# Patient Record
Sex: Female | Born: 1940 | Race: Black or African American | Hispanic: No | Smoking: Never smoker
Health system: Southern US, Community
[De-identification: ages and names within clinical notes are randomized; demographics above are authoritative.]

## PROBLEM LIST (undated history)

## (undated) DIAGNOSIS — I10 Essential (primary) hypertension: Secondary | ICD-10-CM

## (undated) DIAGNOSIS — E785 Hyperlipidemia, unspecified: Secondary | ICD-10-CM

## (undated) DIAGNOSIS — E119 Type 2 diabetes mellitus without complications: Secondary | ICD-10-CM

## (undated) DIAGNOSIS — I509 Heart failure, unspecified: Secondary | ICD-10-CM

## (undated) HISTORY — PX: HEMORRHOID SURGERY: SHX153

## (undated) HISTORY — PX: ABDOMINAL HYSTERECTOMY: SHX81

## (undated) HISTORY — DX: Heart failure, unspecified: I50.9

## (undated) HISTORY — PX: OTHER SURGICAL HISTORY: SHX169

---

## 2007-12-03 ENCOUNTER — Ambulatory Visit: Payer: Self-pay | Admitting: Family Medicine

## 2010-04-27 ENCOUNTER — Ambulatory Visit: Payer: Self-pay | Admitting: Family Medicine

## 2010-09-19 ENCOUNTER — Ambulatory Visit: Payer: Self-pay | Admitting: Family Medicine

## 2011-10-16 ENCOUNTER — Ambulatory Visit: Payer: Self-pay | Admitting: Family Medicine

## 2012-08-26 ENCOUNTER — Ambulatory Visit: Payer: Self-pay | Admitting: Gastroenterology

## 2016-03-01 ENCOUNTER — Inpatient Hospital Stay
Admission: EM | Admit: 2016-03-01 | Discharge: 2016-03-05 | DRG: 441 | Disposition: A | Discharge status: Home / Self Care | Payer: Medicare Other | Attending: Internal Medicine | Admitting: Internal Medicine

## 2016-03-01 ENCOUNTER — Emergency Department: Payer: Medicare Other

## 2016-03-01 ENCOUNTER — Encounter: Payer: Self-pay | Admitting: Emergency Medicine

## 2016-03-01 DIAGNOSIS — Z8249 Family history of ischemic heart disease and other diseases of the circulatory system: Secondary | ICD-10-CM | POA: Diagnosis not present

## 2016-03-01 DIAGNOSIS — K5730 Diverticulosis of large intestine without perforation or abscess without bleeding: Secondary | ICD-10-CM | POA: Diagnosis present

## 2016-03-01 DIAGNOSIS — A4151 Sepsis due to Escherichia coli [E. coli]: Secondary | ICD-10-CM | POA: Diagnosis present

## 2016-03-01 DIAGNOSIS — Z7982 Long term (current) use of aspirin: Secondary | ICD-10-CM | POA: Diagnosis not present

## 2016-03-01 DIAGNOSIS — I81 Portal vein thrombosis: Secondary | ICD-10-CM | POA: Diagnosis present

## 2016-03-01 DIAGNOSIS — I10 Essential (primary) hypertension: Secondary | ICD-10-CM | POA: Diagnosis present

## 2016-03-01 DIAGNOSIS — Z794 Long term (current) use of insulin: Secondary | ICD-10-CM | POA: Diagnosis not present

## 2016-03-01 DIAGNOSIS — I999 Unspecified disorder of circulatory system: Secondary | ICD-10-CM | POA: Diagnosis present

## 2016-03-01 DIAGNOSIS — Z79899 Other long term (current) drug therapy: Secondary | ICD-10-CM | POA: Diagnosis not present

## 2016-03-01 DIAGNOSIS — I879 Disorder of vein, unspecified: Secondary | ICD-10-CM

## 2016-03-01 DIAGNOSIS — E785 Hyperlipidemia, unspecified: Secondary | ICD-10-CM | POA: Diagnosis present

## 2016-03-01 DIAGNOSIS — Z806 Family history of leukemia: Secondary | ICD-10-CM

## 2016-03-01 DIAGNOSIS — E119 Type 2 diabetes mellitus without complications: Secondary | ICD-10-CM | POA: Diagnosis present

## 2016-03-01 DIAGNOSIS — A419 Sepsis, unspecified organism: Secondary | ICD-10-CM | POA: Diagnosis present

## 2016-03-01 HISTORY — DX: Essential (primary) hypertension: I10

## 2016-03-01 HISTORY — DX: Hyperlipidemia, unspecified: E78.5

## 2016-03-01 HISTORY — DX: Type 2 diabetes mellitus without complications: E11.9

## 2016-03-01 LAB — COMPREHENSIVE METABOLIC PANEL
ALT: 50 U/L (ref 14–54)
AST: 48 U/L — ABNORMAL HIGH (ref 15–41)
Albumin: 3.5 g/dL (ref 3.5–5.0)
Alkaline Phosphatase: 108 U/L (ref 38–126)
Anion gap: 7 (ref 5–15)
BUN: 15 mg/dL (ref 6–20)
CALCIUM: 9.8 mg/dL (ref 8.9–10.3)
CHLORIDE: 102 mmol/L (ref 101–111)
CO2: 29 mmol/L (ref 22–32)
CREATININE: 1.21 mg/dL — AB (ref 0.44–1.00)
GFR calc Af Amer: 50 mL/min — ABNORMAL LOW (ref >60)
GFR calc non Af Amer: 43 mL/min — ABNORMAL LOW (ref >60)
Glucose, Bld: 115 mg/dL — ABNORMAL HIGH (ref 65–99)
Potassium: 3.6 mmol/L (ref 3.5–5.1)
SODIUM: 138 mmol/L (ref 135–145)
Total Bilirubin: 0.7 mg/dL (ref 0.3–1.2)
Total Protein: 7.7 g/dL (ref 6.5–8.1)

## 2016-03-01 LAB — URINALYSIS COMPLETE WITH MICROSCOPIC (ARMC ONLY)
BILIRUBIN URINE: NEGATIVE
Bacteria, UA: NONE SEEN
GLUCOSE, UA: NEGATIVE mg/dL
KETONES UR: NEGATIVE mg/dL
Leukocytes, UA: NEGATIVE
Nitrite: NEGATIVE
Protein, ur: NEGATIVE mg/dL
SPECIFIC GRAVITY, URINE: 1.043 — AB (ref 1.005–1.030)
pH: 5 (ref 5.0–8.0)

## 2016-03-01 LAB — LIPASE, BLOOD: Lipase: 16 U/L (ref 11–51)

## 2016-03-01 LAB — CBC WITH DIFFERENTIAL/PLATELET
Basophils Absolute: 0.1 10*3/uL (ref 0–0.1)
EOS ABS: 0.3 10*3/uL (ref 0–0.7)
HCT: 34.6 % — ABNORMAL LOW (ref 35.0–47.0)
Hemoglobin: 11.5 g/dL — ABNORMAL LOW (ref 12.0–16.0)
LYMPHS ABS: 1.6 10*3/uL (ref 1.0–3.6)
Lymphocytes Relative: 11 %
MCH: 28 pg (ref 26.0–34.0)
MCHC: 33.2 g/dL (ref 32.0–36.0)
MCV: 84.4 fL (ref 80.0–100.0)
Monocytes Absolute: 0.8 10*3/uL (ref 0.2–0.9)
Neutro Abs: 12 10*3/uL — ABNORMAL HIGH (ref 1.4–6.5)
Neutrophils Relative %: 81 %
PLATELETS: 315 10*3/uL (ref 150–440)
RBC: 4.1 MIL/uL (ref 3.80–5.20)
RDW: 13.6 % (ref 11.5–14.5)
WBC: 14.9 10*3/uL — AB (ref 3.6–11.0)

## 2016-03-01 LAB — PROTIME-INR
INR: 1.04
Prothrombin Time: 13.8 seconds (ref 11.4–15.0)

## 2016-03-01 LAB — BLOOD GAS, VENOUS
ACID-BASE EXCESS: 5.6 mmol/L — AB (ref 0.0–3.0)
Bicarbonate: 31.7 mEq/L — ABNORMAL HIGH (ref 21.0–28.0)
PATIENT TEMPERATURE: 37
pCO2, Ven: 50 mmHg (ref 44.0–60.0)
pH, Ven: 7.41 (ref 7.320–7.430)
pO2, Ven: <31 mmHg — ABNORMAL LOW (ref 31.0–45.0)

## 2016-03-01 LAB — GLUCOSE, CAPILLARY: GLUCOSE-CAPILLARY: 204 mg/dL — AB (ref 65–99)

## 2016-03-01 LAB — LACTIC ACID, PLASMA
LACTIC ACID, VENOUS: 0.9 mmol/L (ref 0.5–2.0)
Lactic Acid, Venous: 0.8 mmol/L (ref 0.5–2.0)

## 2016-03-01 LAB — APTT: aPTT: 28 seconds (ref 24–36)

## 2016-03-01 MED ORDER — ACETAMINOPHEN 325 MG PO TABS: 650.0000 mg | ORAL_TABLET | Freq: Four times a day (QID) | ORAL | Status: DC | PRN

## 2016-03-01 MED ORDER — IOPAMIDOL (ISOVUE-300) INJECTION 61%
100.0000 mL | Freq: Once | INTRAVENOUS | Status: AC | PRN
  Administered 2016-03-01: 100 mL via INTRAVENOUS

## 2016-03-01 MED ORDER — ONDANSETRON HCL 4 MG/2ML IJ SOLN: 4.0000 mg | Freq: Four times a day (QID) | INTRAMUSCULAR | Status: DC | PRN

## 2016-03-01 MED ORDER — DOCUSATE SODIUM 100 MG PO CAPS
100.0000 mg | ORAL_CAPSULE | Freq: Two times a day (BID) | ORAL | Status: DC
  Administered 2016-03-02: 100 mg via ORAL
  Filled 2016-03-01 (×3): qty 1

## 2016-03-01 MED ORDER — FENTANYL CITRATE (PF) 100 MCG/2ML IJ SOLN: 100.0000 ug | Freq: Once | INTRAMUSCULAR | Status: DC

## 2016-03-01 MED ORDER — LISINOPRIL 20 MG PO TABS
40.0000 mg | ORAL_TABLET | Freq: Every day | ORAL | Status: DC
  Administered 2016-03-02 – 2016-03-05 (×4): 40 mg via ORAL
  Filled 2016-03-01 (×4): qty 2

## 2016-03-01 MED ORDER — MORPHINE SULFATE (PF) 2 MG/ML IV SOLN: 2.0000 mg | INTRAVENOUS | Status: DC | PRN

## 2016-03-01 MED ORDER — INSULIN ASPART PROT & ASPART (70-30 MIX) 100 UNIT/ML ~~LOC~~ SUSP
20.0000 [IU] | Freq: Two times a day (BID) | SUBCUTANEOUS | Status: DC
  Administered 2016-03-02 – 2016-03-05 (×5): 20 [IU] via SUBCUTANEOUS
  Filled 2016-03-01 (×6): qty 20

## 2016-03-01 MED ORDER — SODIUM CHLORIDE 0.9 % IV BOLUS (SEPSIS)
1000.0000 mL | Freq: Once | INTRAVENOUS | Status: AC
  Administered 2016-03-01: 1000 mL via INTRAVENOUS

## 2016-03-01 MED ORDER — DEXTROSE 5 % IV SOLN
1.0000 g | INTRAVENOUS | Status: DC
  Filled 2016-03-01: qty 10

## 2016-03-01 MED ORDER — OXYCODONE HCL 5 MG PO TABS: 5.0000 mg | ORAL_TABLET | ORAL | Status: DC | PRN

## 2016-03-01 MED ORDER — DIATRIZOATE MEGLUMINE & SODIUM 66-10 % PO SOLN
15.0000 mL | Freq: Once | ORAL | Status: AC
Start: 2016-03-01 — End: 2016-03-01
  Administered 2016-03-01: 15 mL via ORAL

## 2016-03-01 MED ORDER — SODIUM CHLORIDE 0.9 % IV SOLN
INTRAVENOUS | Status: AC
  Administered 2016-03-01 – 2016-03-02 (×2): via INTRAVENOUS

## 2016-03-01 MED ORDER — SIMVASTATIN 20 MG PO TABS
20.0000 mg | ORAL_TABLET | Freq: Every day | ORAL | Status: DC
  Administered 2016-03-02 – 2016-03-05 (×4): 20 mg via ORAL
  Filled 2016-03-01 (×4): qty 1

## 2016-03-01 MED ORDER — INSULIN ASPART 100 UNIT/ML ~~LOC~~ SOLN
0.0000 [IU] | Freq: Three times a day (TID) | SUBCUTANEOUS | Status: DC
  Administered 2016-03-04 (×2): 1 [IU] via SUBCUTANEOUS
  Filled 2016-03-01 (×2): qty 1

## 2016-03-01 MED ORDER — GLIPIZIDE ER 5 MG PO TB24
10.0000 mg | ORAL_TABLET | Freq: Two times a day (BID) | ORAL | Status: DC
  Administered 2016-03-02: 09:00:00 10 mg via ORAL
  Filled 2016-03-01: qty 2

## 2016-03-01 MED ORDER — INSULIN ASPART 100 UNIT/ML ~~LOC~~ SOLN
0.0000 [IU] | Freq: Every day | SUBCUTANEOUS | Status: DC
  Administered 2016-03-01: 2 [IU] via SUBCUTANEOUS
  Filled 2016-03-01: qty 2

## 2016-03-01 MED ORDER — AMLODIPINE BESYLATE 10 MG PO TABS
10.0000 mg | ORAL_TABLET | Freq: Every day | ORAL | Status: DC
  Administered 2016-03-02 – 2016-03-05 (×4): 10 mg via ORAL
  Filled 2016-03-01 (×4): qty 1

## 2016-03-01 MED ORDER — FENTANYL CITRATE (PF) 100 MCG/2ML IJ SOLN
INTRAMUSCULAR | Status: AC
  Administered 2016-03-01: 100 ug via INTRAVENOUS
  Filled 2016-03-01: qty 2

## 2016-03-01 MED ORDER — HEPARIN BOLUS VIA INFUSION
4500.0000 [IU] | Freq: Once | INTRAVENOUS | Status: AC
  Administered 2016-03-01: 4500 [IU] via INTRAVENOUS
  Filled 2016-03-01: qty 4500

## 2016-03-01 MED ORDER — GLIPIZIDE ER 5 MG PO TB24: 10.0000 mg | ORAL_TABLET | Freq: Two times a day (BID) | ORAL | Status: DC

## 2016-03-01 MED ORDER — ONDANSETRON HCL 4 MG PO TABS: 4.0000 mg | ORAL_TABLET | Freq: Four times a day (QID) | ORAL | Status: DC | PRN

## 2016-03-01 MED ORDER — HEPARIN (PORCINE) IN NACL 100-0.45 UNIT/ML-% IJ SOLN
1800.0000 [IU]/h | INTRAMUSCULAR | Status: DC
  Administered 2016-03-01: 1400 [IU]/h via INTRAVENOUS
  Administered 2016-03-03 (×2): 1800 [IU]/h via INTRAVENOUS
  Filled 2016-03-01 (×10): qty 250

## 2016-03-01 MED ORDER — ACETAMINOPHEN 500 MG PO TABS
1000.0000 mg | ORAL_TABLET | Freq: Once | ORAL | Status: AC
  Administered 2016-03-01: 1000 mg via ORAL
  Filled 2016-03-01: qty 2

## 2016-03-01 MED ORDER — FENTANYL CITRATE (PF) 100 MCG/2ML IJ SOLN
100.0000 ug | Freq: Once | INTRAMUSCULAR | Status: AC
  Administered 2016-03-01: 100 ug via INTRAVENOUS

## 2016-03-01 MED ORDER — POLYETHYLENE GLYCOL 3350 17 G PO PACK: 17.0000 g | PACK | Freq: Every day | ORAL | Status: DC | PRN

## 2016-03-01 MED ORDER — DEXTROSE 5 % IV SOLN
1.0000 g | Freq: Once | INTRAVENOUS | Status: AC
  Administered 2016-03-02: 1 g via INTRAVENOUS
  Filled 2016-03-01: qty 10

## 2016-03-01 MED ORDER — PIPERACILLIN-TAZOBACTAM 3.375 G IVPB: 3.3750 g | Freq: Three times a day (TID) | INTRAVENOUS | Status: DC

## 2016-03-01 MED ORDER — ACETAMINOPHEN 650 MG RE SUPP
650.0000 mg | Freq: Four times a day (QID) | RECTAL | Status: DC | PRN
Start: 2016-03-01 — End: 2016-03-05

## 2016-03-01 MED ORDER — PIPERACILLIN-TAZOBACTAM 3.375 G IVPB 30 MIN
3.3750 g | Freq: Once | INTRAVENOUS | Status: AC
  Administered 2016-03-01: 3.375 g via INTRAVENOUS
  Filled 2016-03-01: qty 50

## 2016-03-01 NOTE — Progress Notes (Signed)
ANTICOAGULATION CONSULT NOTE - Initial Consult  Pharmacy Consult for Heparin Drip Indication: portal vein thrombosis  No Known Allergies  Patient Measurements: Weight: 203 lb 0.7 oz (92.1 kg) Heparin Dosing Weight: 83.5 kg Ht from Care Everywhere of 68 in Est CrCl~49 mL/min  Vital Signs: Temp: 99.2 F (37.3 C) (05/25 1937) Temp Source: Oral (05/25 1937) BP: 146/59 mmHg (05/25 1800) Pulse Rate: 96 (05/25 2000)  Labs:  Recent Labs  03/01/16 1630 03/01/16 1650  HGB 11.5*  --   HCT 34.6*  --   PLT 315  --   APTT  --  28  LABPROT  --  13.8  INR  --  1.04  CREATININE 1.21*  --     CrCl cannot be calculated (Unknown ideal weight.).   Medical History: Past Medical History  Diagnosis Date  . Diabetes mellitus without complication (HCC)     Medications:  Scheduled:   Infusions:  . heparin 1,400 Units/hr (03/01/16 2013)  . piperacillin-tazobactam (ZOSYN)  IV      Assessment: Pharmacy consulted to dose heparin drip in a 75 yo female with portal vein thrombosis.    INR and aPTT pending - no anticoagulants listed in H/P or med rec  Goal of Therapy:  Heparin level 0.3-0.7 units/ml Monitor platelets by anticoagulation protocol: Yes   Plan:  Will give bolus of 4500 units IV once and start drip at rate of 1400 units/hr. Will check HL in 8 hours based on age >70 years. CBC ordered in AM.   Pharmacy will continue to follow.  Shanay Woolman G 03/01/2016,8:25 PM

## 2016-03-01 NOTE — H&P (Signed)
[facility] [place] at [facility]   PATIENT NAME: Becky Sutton    MR#:  [id number]  DATE OF BIRTH:  [birth year]  DATE OF ADMISSION:  03/01/2016  PRIMARY CARE PHYSICIAN: No primary care provider on file.   REQUESTING/REFERRING PHYSICIAN: Dr. Glenetta Hew  CHIEF COMPLAINT:   Chief Complaint  Patient presents with  . Fever    HISTORY OF PRESENT ILLNESS:  Becky Sutton  is a 75 y.o. female with a known history of Hypertension, insulin-dependent diabetes mellitus, hyperlipidemia comes from home secondary to fevers and chills. Patient started feeling sick since yesterday. She had chills all day yesterday, very weak and in morning get out of her bed. Got better towards the end of the day. This morning she woke up she had chills again, temperature of 102.63F here in the emergency room. WBC is elevated. Denies any dysuria with increased frequency of urination. No nausea or vomiting or diarrhea. No cough, congestion or chest pain. Chest x-ray is clear. CT of the abdomen showing partially occlusive portal vein thrombosis in the left hepatic lobe, no cirrhosis.  UA is pending.  PAST MEDICAL HISTORY:   Past Medical History  Diagnosis Date  . Diabetes mellitus without complication (HCC)   . Hypertension   . Hyperlipidemia     PAST SURGICAL HISTORY:   Past Surgical History  Procedure Laterality Date  . Abdominal hysterectomy    . Hemorrhoid surgery    . Cataract surgery      SOCIAL HISTORY:   Social History  Substance Use Topics  . Smoking status: Never Smoker   . Smokeless tobacco: Not on file  . Alcohol Use: No    FAMILY HISTORY:   Family History  Problem Relation Age of Onset  . Leukemia Mother   . CAD Father     DRUG ALLERGIES:  No Known Allergies  REVIEW OF SYSTEMS:   Review of Systems  Constitutional: Positive for chills and malaise/fatigue. Negative for fever and weight loss.  HENT: Negative for ear discharge, ear pain, hearing loss and  nosebleeds.   Eyes: Negative for blurred vision, double vision and photophobia.  Respiratory: Negative for cough, hemoptysis, shortness of breath and wheezing.   Cardiovascular: Negative for chest pain, palpitations, orthopnea and leg swelling.  Gastrointestinal: Positive for abdominal pain. Negative for heartburn, nausea, vomiting, diarrhea, constipation and melena.  Genitourinary: Positive for frequency. Negative for dysuria, urgency and hematuria.  Musculoskeletal: Negative for myalgias, back pain and neck pain.  Skin: Negative for rash.  Neurological: Negative for dizziness, tingling, sensory change, speech change, focal weakness and headaches.  Endo/Heme/Allergies: Does not bruise/bleed easily.  Psychiatric/Behavioral: Negative for depression.    MEDICATIONS AT HOME:   Prior to Admission medications   Medication Sig Start Date End Date Taking? Authorizing Provider  amLODipine (NORVASC) 10 MG tablet Take 10 mg by mouth daily.   Yes Historical Provider, MD  aspirin EC 81 MG tablet Take 81 mg by mouth daily.   Yes Historical Provider, MD  furosemide (LASIX) 20 MG tablet Take 20 mg by mouth daily.   Yes Historical Provider, MD  glipiZIDE (GLUCOTROL XL) 10 MG 24 hr tablet Take 10 mg by mouth 2 (two) times daily.   Yes Historical Provider, MD  insulin aspart protamine - aspart (NOVOLOG MIX 70/30 FLEXPEN) (70-30) 100 UNIT/ML FlexPen Inject 20 Units into the skin 2 (two) times daily.   Yes Historical Provider, MD  quinapril (ACCUPRIL) 40 MG tablet Take 40 mg by mouth daily.   Yes  Historical Provider, MD  simvastatin (ZOCOR) 20 MG tablet Take 20 mg by mouth daily.   Yes Historical Provider, MD      VITAL SIGNS:  Blood pressure 146/59, pulse 96, temperature 99.2 F (37.3 C), temperature source Oral, resp. rate 15, weight 92.1 kg (203 lb 0.7 oz), SpO2 99 %.  PHYSICAL EXAMINATION:   Physical Exam  GENERAL:  75 y.o.-year-old obese patient lying in the bed with no acute distress.  EYES:  Pupils equal, round, reactive to light and accommodation. No scleral icterus. Right eye ptosis. Extraocular muscles intact.  HEENT: Head atraumatic, normocephalic. Oropharynx and nasopharynx clear.  NECK:  Supple, no jugular venous distention. No thyroid enlargement, no tenderness.  LUNGS: Normal breath sounds bilaterally, no wheezing, rales or crepitation. No use of accessory muscles of respiration. Bibasilar rhonchi CARDIOVASCULAR: S1, S2 normal. No murmurs, rubs, or gallops.  ABDOMEN: Soft, nontender, nondistended. Bowel sounds present. No organomegaly or mass.  EXTREMITIES: No pedal edema, cyanosis, or clubbing.  NEUROLOGIC: Cranial nerves II through XII are intact. Muscle strength 5/5 in all extremities. Sensation intact. Gait not checked.  PSYCHIATRIC: The patient is alert and oriented x 3.  SKIN: No obvious rash, lesion, or ulcer.   LABORATORY PANEL:   CBC  Recent Labs Lab 03/01/16 1630  WBC 14.9*  HGB 11.5*  HCT 34.6*  PLT 315   ------------------------------------------------------------------------------------------------------------------  Chemistries   Recent Labs Lab 03/01/16 1630  NA 138  K 3.6  CL 102  CO2 29  GLUCOSE 115*  BUN 15  CREATININE 1.21*  CALCIUM 9.8  AST 48*  ALT 50  ALKPHOS 108  BILITOT 0.7   ------------------------------------------------------------------------------------------------------------------  Cardiac Enzymes No results for input(s): TROPONINI in the last 168 hours. ------------------------------------------------------------------------------------------------------------------  RADIOLOGY:  Ct Abdomen Pelvis W Contrast  03/01/2016  CLINICAL DATA:  Right lower quadrant abdominal pain with fever and chills. Previous hysterectomy. EXAM: CT ABDOMEN AND PELVIS WITH CONTRAST TECHNIQUE: Multidetector CT imaging of the abdomen and pelvis was performed using the standard protocol following bolus administration of intravenous  contrast. CONTRAST:  100mL ISOVUE-300 IOPAMIDOL (ISOVUE-300) INJECTION 61% COMPARISON:  Abdominal ultrasound 04/27/2010. FINDINGS: Lower chest: Trace pleural effusion on the right with minimal bibasilar atelectasis. Hepatobiliary: There is branching low-density within the left hepatic lobe consistent with partial portal vein thrombosis. The portal vein is patent in the right lobe. No focal hepatic parenchymal lesions are present. The gallbladder is incompletely distended. There is no surrounding inflammation or biliary dilatation. Pancreas: Unremarkable. No pancreatic ductal dilatation or surrounding inflammatory changes. Spleen: Normal in size without focal abnormality. Adrenals/Urinary Tract: Both adrenal glands appear normal. The kidneys appear normal without evidence of urinary tract calculus, suspicious lesion or hydronephrosis. No bladder abnormalities are seen. Stomach/Bowel: No evidence of bowel wall thickening, distention or surrounding inflammatory change. There is prominent fat deposition within the wall of the right colon. The terminal ileum appears normal. The appendix is probably visualized on the coronal images and appears normal. There are diverticular changes throughout the descending and sigmoid colon. Vascular/Lymphatic: Small lymph nodes in the porta hepatis are not pathologically enlarged. There is no retroperitoneal lymphadenopathy. There are few scattered prominent mesenteric lymph nodes. As above, there is partial portal vein thrombosis within the left lobe of the liver. The main portal vein, superior mesenteric and splenic veins are patent. Mild aortic and branch vessel atherosclerosis. Reproductive: Hysterectomy.  No adnexal mass. Other: Bilateral inguinal hernias containing fat. No herniated bowel. Musculoskeletal: No acute or significant osseous findings. There is an 8 mm nonspecific  sclerotic lesion in the right aspect of the L3 vertebral body (image number 40). No lytic lesion.  IMPRESSION: 1. 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 The main portal vein is patent. No other evidence of vascular occlusion. 2. Prominent fat deposition in the wall of the right colon as can be seen with chronic inflammation. No acute inflammation identified. The appendix appears normal. 3. Distal colonic diverticulosis without surrounding inflammation. 4. I attempted to personally call these results twice on 03/01/2016 at 6:56 pm to Dr. Maurilio Lovely , but was not able to reach her. These results will be called to the ordering clinician or representative by the Radiologist Assistant, and communication documented in the PACS or zVision Dashboard. Electronically Signed   By: Carey Bullocks M.D.   On: 03/01/2016 18:58   Dg Chest Port 1 View  03/01/2016  CLINICAL DATA:  Fever and chills, fever of 103.4 degrees EXAM: PORTABLE CHEST 1 VIEW COMPARISON:  None. FINDINGS: The heart size and vascular pattern are normal. There is no consolidation or effusion. There is uncoiling of the aorta. IMPRESSION: No active disease. Electronically Signed   By: Esperanza Heir M.D.   On: 03/01/2016 16:43    EKG:   Orders placed or performed during the hospital encounter of 03/01/16  . ED EKG 12-Lead  . ED EKG 12-Lead    IMPRESSION AND PLAN:   Becky Sutton  is a 75 y.o. female with a known history of Hypertension, insulin-dependent diabetes mellitus, hyperlipidemia comes from home secondary to fevers and chills.  #1 Sepsis- unknown source now, CXR with no pneumonia UA still pending, blood cultures are ordered Started rocephin. Could be the acute clot causing fevers Rarely could be acute pylephlebitis from her clot- if it is infected, but no abdominal tenderness Monitor  #2 Partial portal vein thrombosis- unknown cause, check hypercoagulable work up - no cirrhosis - GI consulted. Started on IV heparin. Since no cirrhosis, likely xarelto or eliquis can be used for discharge  #3 DM-  continue glipizide, 70/30 insulin and also added sliding scale insulin  #4 HTN- on quinapril  #5 DVT Prophylaxis- on heparin drip    All the records are reviewed and case discussed with ED provider. Management plans discussed with the patient, family and they are in agreement.  CODE STATUS: Full Code  TOTAL TIME TAKING CARE OF THIS PATIENT: 50 minutes.    Enid Baas M.D on 03/01/2016 at 8:28 PM  Between 7am to 6pm - Pager - [phone]  After 6pm go to [email or web address] - password [email or web address]  [facility]  Office  [phone]  CC: Primary care physician; No primary care provider on file.

## 2016-03-01 NOTE — ED Provider Notes (Signed)
[facility] Emergency Department Provider Note ____________________________________________  Time seen: Approximately 4:20 PM  I have reviewed the triage vital signs and the nursing notes.   HISTORY  Chief Complaint Fever   HPI Becky Sutton is a 75 y.o. female with a history of diabetes who presents from home via EMS for fevers and chills that began yesterday. On EMS arrival her temp was 103.4, heart rate was 128, she was normotensive, SaO2 was 88% on room air, CBG was 111.  Patient notes that she had pink urine several days ago but was not having any abdominal pain until today when she notes lower abdominal pain and right lower quadrant pain that is worse when she sits up. She has also had a nonproductive cough for several days. She denies any sore throat or rhinorrhea. She has not taken anything for her fever. She is normally active and in good health.  Past Medical History  Diagnosis Date  . Diabetes mellitus without complication (HCC)   . Hypertension   . Hyperlipidemia     Patient Active Problem List   Diagnosis Date Noted  . Sepsis (HCC) 03/01/2016    Past Surgical History  Procedure Laterality Date  . Abdominal hysterectomy    . Hemorrhoid surgery    . Cataract surgery     She reports total hysterectomy several years ago but no other abdominal surgery  No current outpatient prescriptions on file.  Allergies Review of patient's allergies indicates no known allergies.  Family History  Problem Relation Age of Onset  . Leukemia Mother   . CAD Father     Social History Social History  Substance Use Topics  . Smoking status: Never Smoker   . Smokeless tobacco: None  . Alcohol Use: No    Review of Systems Constitutional: See history of present illness Eyes: No visual changes. ENT: No sore throat. Cardiovascular: Denies chest pain. Respiratory: Denies shortness of breath. Gastrointestinal: See history of present  illness Genitourinary: Negative for dysuria. Did have pink urine several days ago Musculoskeletal: Negative for back pain. Skin: Negative for rash. Neurological: Negative for headaches, focal weakness or numbness.  10-point ROS otherwise negative.  ____________________________________________   PHYSICAL EXAM:  VITAL SIGNS: ED Triage Vitals  Enc Vitals Group     BP --      Pulse --      Resp --      Temp --      Temp src --      SpO2 --      Weight --      Height --      Head Cir --      Peak Flow --      Pain Score --      Pain Loc --      Pain Edu? --      Excl. in GC? --    Constitutional: Alert and oriented. Well appearing and in no acute distress. Eyes: Conjunctivae are normal. PERRL. EOMI. Head: Atraumatic. Nose: No congestion/rhinnorhea. Mouth/Throat: Mucous membranes are moist.  Oropharynx non-erythematous. Neck: No stridor.   Cardiovascular: Tachycardic with regular rhythm. Grossly normal heart sounds.  Good peripheral circulation. Respiratory: Normal respiratory effort.  No retractions. Lungs CTAB. Gastrointestinal: Soft; moderate tenderness right lower quadrant, mild tenderness suprapubic, no rebound or guarding. No CVA tenderness. Musculoskeletal: No lower extremity tenderness nor edema.   Neurologic:  Normal speech and language. No gross focal neurologic deficits are appreciated. Skin:  Skin is warm, dry and intact.  No rash noted. Psychiatric: Mood and affect are normal. Speech and behavior are normal.  ____________________________________________   LABS (all labs ordered are listed, but only abnormal results are displayed)  Labs Reviewed  COMPREHENSIVE METABOLIC PANEL - Abnormal; Notable for the following:    Glucose, Bld 115 (*)    Creatinine, Ser 1.21 (*)    AST 48 (*)    GFR calc non Af Amer 43 (*)    GFR calc Af Amer 50 (*)    All other components within normal limits  CBC WITH DIFFERENTIAL/PLATELET - Abnormal; Notable for the following:     WBC 14.9 (*)    Hemoglobin 11.5 (*)    HCT 34.6 (*)    Neutro Abs 12.0 (*)    All other components within normal limits  BLOOD GAS, VENOUS - Abnormal; Notable for the following:    pO2, Ven <31.0 (*)    Bicarbonate 31.7 (*)    Acid-Base Excess 5.6 (*)    All other components within normal limits  URINALYSIS COMPLETEWITH MICROSCOPIC ([facility] ONLY) - Abnormal; Notable for the following:    Color, Urine YELLOW (*)    APPearance CLEAR (*)    Specific Gravity, Urine 1.043 (*)    Hgb urine dipstick 2+ (*)    Squamous Epithelial / LPF 0-5 (*)    All other components within normal limits  GLUCOSE, CAPILLARY - Abnormal; Notable for the following:    Glucose-Capillary 204 (*)    All other components within normal limits  CULTURE, BLOOD (ROUTINE X 2)  CULTURE, BLOOD (ROUTINE X 2)  URINE CULTURE  LACTIC ACID, PLASMA  LACTIC ACID, PLASMA  LIPASE, BLOOD  PROTIME-INR  APTT  CBC  HEMOGLOBIN A1C  HEPARIN LEVEL (UNFRACTIONATED)  LUPUS ANTICOAGULANT PANEL  BETA-2-GLYCOPROTEIN I ABS, IGG/M/A  FACTOR 5 LEIDEN  BASIC METABOLIC PANEL  HEMOGLOBIN A1C   ____________________________________________  EKG  ED ECG REPORT I, Maurilio Lovely, the attending physician, personally viewed and interpreted this ECG.   Date: 03/01/2016  EKG Time: 1622  Rate: 132  Rhythm: Sinus tachycardia  Axis: Normal  Intervals: None  ST&T Change: None  ____________________________________________  RADIOLOGY  cxr-IMPRESSION: No active disease.   Electronically Signed By: Esperanza Heir M.D. On: 03/01/2016 16:43  CT abd/pelvis: IMPRESSION: 1. 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 The main portal vein is patent. No other evidence of vascular occlusion. 2. Prominent fat deposition in the wall of the right colon as can be seen with chronic inflammation. No acute inflammation identified. The appendix appears normal. 3. Distal colonic diverticulosis without surrounding  inflammation. __________________________________________________________________________________   INITIAL IMPRESSION / ASSESSMENT AND PLAN / ED COURSE  Pertinent labs & imaging results that were available during my care of the patient were reviewed by me and considered in my medical decision making (see chart for details).  ----------------------------------------- 5:00 PM on 03/01/2016 ----------------------------------------- Since 2 daughters are at the bedside. They're updated with the plan. Patient reports feeling pain has improved since fentanyl was given. Pulse rate improving with IV fluids but still tachycardic; will continue to resuscitate.  ----------------------------------------- 6:22 PM on 03/01/2016 -----------------------------------------  Patient feeling much better. Heart rate 109. Systolic Blood pressure 142. CT here to take patient for imaging of abdomen/pelvis.  ----------------------------------------- 7:22 PM on 03/01/2016 ----------------------------------------- I d/w Dr. Wyn Quaker, vascular surgery; advises heparin & admission to hospitalist.   ____________________________________________   FINAL CLINICAL IMPRESSION(S) / ED DIAGNOSES  Final diagnoses:  Disorder of portal venous system  New prescriptions started this visit Current Discharge Medication List        Maurilio LovelyNoelle Christy Ehrsam, MD 03/02/16 [phone]

## 2016-03-01 NOTE — ED Notes (Signed)
sandwich tray given  

## 2016-03-01 NOTE — ED Notes (Signed)
Pt from home via EMS for fever and chills , fever  of 103.4, pt  tachy at 128. Pt states she had some vaginal bleeding that went on Saturday. Pt A&O

## 2016-03-01 NOTE — ED Notes (Signed)
Called to give report at this time and given message, receiving RN will call me back

## 2016-03-01 NOTE — Progress Notes (Signed)
Pharmacy Antibiotic Note  Becky GuarneriBarbara A Sutton is a 75 y.o. female admitted on 03/01/2016 with intra-abdominal infection.  Pharmacy has been consulted for Zsoyn dosing.  Plan: Zosyn 3.375g IV q8h (4 hour infusion).  Weight: 203 lb 0.7 oz (92.1 kg)  Temp (24hrs), Avg:100.8 F (38.2 C), Min:99.2 F (37.3 C), Max:102.6 F (39.2 C)   Recent Labs Lab 03/01/16 1630 03/01/16 1635  WBC 14.9*  --   CREATININE 1.21*  --   LATICACIDVEN  --  0.9    CrCl cannot be calculated (Unknown ideal weight.).    No Known Allergies  Antimicrobials this admission: 5/25 Zosyn>>    Microbiology results: 5/25 BCx: pending 5/25 UCx: pending   Thank you for allowing pharmacy to be a part of this patient's care.  Zaim Nitta G 03/01/2016 7:58 PM

## 2016-03-01 NOTE — Progress Notes (Signed)
Pharmacy Antibiotic Note  Becky Sutton is a 75 y.o. female admitted on 03/01/2016 with UTI.  Pharmacy has been consulted for ceftriaxone dosing.  Plan: Ceftriaxone 1 g IV daily for UTI  Weight: 203 lb 0.7 oz (92.1 kg)  Temp (24hrs), Avg:100.8 F (38.2 C), Min:99.2 F (37.3 C), Max:102.6 F (39.2 C)   Recent Labs Lab 03/01/16 1630 03/01/16 1635  WBC 14.9*  --   CREATININE 1.21*  --   LATICACIDVEN  --  0.9    CrCl cannot be calculated (Unknown ideal weight.).    No Known Allergies  Thank you for allowing pharmacy to be a part of this patient's care.  Becky Sutton, PharmD 03/01/2016 8:59 PM

## 2016-03-02 LAB — GLUCOSE, CAPILLARY
GLUCOSE-CAPILLARY: 105 mg/dL — AB (ref 65–99)
GLUCOSE-CAPILLARY: 56 mg/dL — AB (ref 65–99)
GLUCOSE-CAPILLARY: 84 mg/dL (ref 65–99)
Glucose-Capillary: 107 mg/dL — ABNORMAL HIGH (ref 65–99)
Glucose-Capillary: 101 mg/dL — ABNORMAL HIGH (ref 65–99)

## 2016-03-02 LAB — HEMOGLOBIN A1C: Hgb A1c MFr Bld: 6.9 % — ABNORMAL HIGH (ref 4.0–6.0)

## 2016-03-02 LAB — BLOOD CULTURE ID PANEL (REFLEXED)
Acinetobacter baumannii: NOT DETECTED
CANDIDA ALBICANS: NOT DETECTED
CANDIDA GLABRATA: NOT DETECTED
CANDIDA PARAPSILOSIS: NOT DETECTED
CANDIDA TROPICALIS: NOT DETECTED
Candida krusei: NOT DETECTED
Carbapenem resistance: NOT DETECTED
ENTEROBACTER CLOACAE COMPLEX: NOT DETECTED
ENTEROCOCCUS SPECIES: NOT DETECTED
ESCHERICHIA COLI: DETECTED — AB
Enterobacteriaceae species: DETECTED — AB
Haemophilus influenzae: NOT DETECTED
KLEBSIELLA PNEUMONIAE: NOT DETECTED
Klebsiella oxytoca: NOT DETECTED
Listeria monocytogenes: NOT DETECTED
Methicillin resistance: NOT DETECTED
Neisseria meningitidis: NOT DETECTED
PROTEUS SPECIES: NOT DETECTED
Pseudomonas aeruginosa: NOT DETECTED
STREPTOCOCCUS PNEUMONIAE: NOT DETECTED
Serratia marcescens: NOT DETECTED
Staphylococcus aureus (BCID): NOT DETECTED
Staphylococcus species: NOT DETECTED
Streptococcus agalactiae: NOT DETECTED
Streptococcus pyogenes: NOT DETECTED
Streptococcus species: NOT DETECTED
VANCOMYCIN RESISTANCE: NOT DETECTED

## 2016-03-02 LAB — CBC
HEMATOCRIT: 32.1 % — AB (ref 35.0–47.0)
Hemoglobin: 10.6 g/dL — ABNORMAL LOW (ref 12.0–16.0)
MCH: 27.9 pg (ref 26.0–34.0)
MCHC: 32.9 g/dL (ref 32.0–36.0)
MCV: 84.6 fL (ref 80.0–100.0)
PLATELETS: 299 10*3/uL (ref 150–440)
RBC: 3.8 MIL/uL (ref 3.80–5.20)
RDW: 13.9 % (ref 11.5–14.5)
WBC: 19.5 10*3/uL — ABNORMAL HIGH (ref 3.6–11.0)

## 2016-03-02 LAB — ANTITHROMBIN III: ANTITHROMB III FUNC: 94 % (ref 75–120)

## 2016-03-02 LAB — BASIC METABOLIC PANEL
Anion gap: 7 (ref 5–15)
BUN: 15 mg/dL (ref 6–20)
CHLORIDE: 106 mmol/L (ref 101–111)
CO2: 26 mmol/L (ref 22–32)
CREATININE: 1.21 mg/dL — AB (ref 0.44–1.00)
Calcium: 8.6 mg/dL — ABNORMAL LOW (ref 8.9–10.3)
GFR calc non Af Amer: 43 mL/min — ABNORMAL LOW (ref >60)
GFR, EST AFRICAN AMERICAN: 50 mL/min — AB (ref >60)
Glucose, Bld: 139 mg/dL — ABNORMAL HIGH (ref 65–99)
POTASSIUM: 3.5 mmol/L (ref 3.5–5.1)
Sodium: 139 mmol/L (ref 135–145)

## 2016-03-02 LAB — HEPARIN LEVEL (UNFRACTIONATED)
HEPARIN UNFRACTIONATED: 0.19 [IU]/mL — AB (ref 0.30–0.70)
HEPARIN UNFRACTIONATED: 0.43 [IU]/mL (ref 0.30–0.70)
Heparin Unfractionated: 0.25 IU/mL — ABNORMAL LOW (ref 0.30–0.70)

## 2016-03-02 MED ORDER — HEPARIN BOLUS VIA INFUSION
2500.0000 [IU] | Freq: Once | INTRAVENOUS | Status: AC
Start: 2016-03-02 — End: 2016-03-02
  Administered 2016-03-02: 2500 [IU] via INTRAVENOUS
  Filled 2016-03-02: qty 2500

## 2016-03-02 MED ORDER — SODIUM CHLORIDE 0.9 % IV SOLN
1.0000 g | Freq: Three times a day (TID) | INTRAVENOUS | Status: DC
  Administered 2016-03-02 – 2016-03-04 (×7): 1 g via INTRAVENOUS
  Filled 2016-03-02 (×11): qty 1

## 2016-03-02 MED ORDER — HEPARIN BOLUS VIA INFUSION
1250.0000 [IU] | Freq: Once | INTRAVENOUS | Status: AC
  Administered 2016-03-02: 1250 [IU] via INTRAVENOUS
  Filled 2016-03-02: qty 1250

## 2016-03-02 NOTE — Progress Notes (Signed)
Pharmacy Antibiotic Note  Becky GuarneriBarbara A Giebler is a 75 y.o. female admitted on 03/01/2016 with UTI/sepsis.  Pharmacy has been consulted for ceftriaxone dosing.  Blood cultures with e. coli  Plan: Discussed BCID results with Dr. Imogene Burnhen. BCID algorithm recommends meropenem, OK to change.  Meropenem 1gm IV Q8H  Patient at threshold for dose reduction, continue to monitor renal function and decrease dose if renal function worsens  Height: 5\' 8"  (172.7 cm) Weight: 208 lb 1.6 oz (94.394 kg) IBW/kg (Calculated) : 63.9  Temp (24hrs), Avg:100.1 F (37.8 C), Min:98.5 F (36.9 C), Max:102.6 F (39.2 C)   Recent Labs Lab 03/01/16 1630 03/01/16 1635 03/01/16 2302 03/02/16 0410  WBC 14.9*  --   --  19.5*  CREATININE 1.21*  --   --  1.21*  LATICACIDVEN  --  0.9 0.8  --     Estimated Creatinine Clearance: 49.7 mL/min (by C-G formula based on Cr of 1.21).    No Known Allergies   Microbiology 5/25 Blood - GNR x 2 - E. Coli sens pending 5/25 Urine  Thank you for allowing pharmacy to be a part of this patient's care.  Martyn MalayBarefoot,Zorana Brockwell C, PharmD 03/02/2016 12:25 PM

## 2016-03-02 NOTE — Plan of Care (Signed)
Problem: Fluid Volume: Goal: Ability to maintain a balanced intake and output will improve Outcome: Progressing Remains on IVF's.  Problem: Fluid Volume: Goal: Hemodynamic stability will improve Outcome: Progressing Remains on IVF's. Titration of Heparin gtt based on pharmacy order.  Problem: Physical Regulation: Goal: Signs and symptoms of infection will decrease Outcome: Progressing Meropenem IV Antibiotics initiated.

## 2016-03-02 NOTE — Progress Notes (Signed)
ANTICOAGULATION CONSULT NOTE - Initial Consult  Pharmacy Consult for Heparin Drip Indication: portal vein thrombosis  No Known Allergies  Patient Measurements: Height: 5\' 8"  (172.7 cm) Weight: 208 lb 1.6 oz (94.394 kg) IBW/kg (Calculated) : 63.9 Heparin Dosing Weight: 83.5 kg Ht from Care Everywhere of 68 in Est CrCl~49 mL/min  Vital Signs: Temp: 98.5 F (36.9 C) (05/25 2159) Temp Source: Oral (05/25 2159) BP: 159/69 mmHg (05/25 2159) Pulse Rate: 99 (05/25 2159)  Labs:  Recent Labs  03/01/16 1630 03/01/16 1650 03/02/16 0410  HGB 11.5*  --  10.6*  HCT 34.6*  --  32.1*  PLT 315  --  299  APTT  --  28  --   LABPROT  --  13.8  --   INR  --  1.04  --   HEPARINUNFRC  --   --  0.25*  CREATININE 1.21*  --  1.21*    Estimated Creatinine Clearance: 49.7 mL/min (by C-G formula based on Cr of 1.21).   Medical History: Past Medical History  Diagnosis Date  . Diabetes mellitus without complication (HCC)   . Hypertension   . Hyperlipidemia     Medications:  Scheduled:  . amLODipine  10 mg Oral Daily  . cefTRIAXone (ROCEPHIN)  IV  1 g Intravenous Q24H  . docusate sodium  100 mg Oral BID  . glipiZIDE  10 mg Oral BID WC  . heparin  1,250 Units Intravenous Once  . insulin aspart  0-5 Units Subcutaneous QHS  . insulin aspart  0-9 Units Subcutaneous TID WC  . insulin aspart protamine- aspart  20 Units Subcutaneous BID WC  . lisinopril  40 mg Oral Daily  . simvastatin  20 mg Oral Daily   Infusions:  . sodium chloride 75 mL/hr at 03/01/16 2334  . heparin 1,400 Units/hr (03/01/16 2013)    Assessment: Pharmacy consulted to dose heparin drip in a 75 yo female with portal vein thrombosis.    INR and aPTT pending - no anticoagulants listed in H/P or med rec  Goal of Therapy:  Heparin level 0.3-0.7 units/ml Monitor platelets by anticoagulation protocol: Yes   Plan:  Heparin level subtherapeutic. 1250 unit IV x 1 bolus and increase rate to 1550 units/hr. Will recheck  heparin level in 8 hours.  Carola FrostNathan A Leilani Cespedes, Pharm.D., BCPS Clinical Pharmacist 03/02/2016,5:01 AM

## 2016-03-02 NOTE — Progress Notes (Signed)
[facility] - [place] at [facility]   PATIENT NAME: Becky Sutton    MR#:  [id number]  DATE OF BIRTH:  [birth year]  SUBJECTIVE:  CHIEF COMPLAINT:   Chief Complaint  Patient presents with  . Fever   No complaint. REVIEW OF SYSTEMS:  CONSTITUTIONAL: No fever, fatigue or weakness.  EYES: No blurred or double vision.  EARS, NOSE, AND THROAT: No tinnitus or ear pain.  RESPIRATORY: No cough, shortness of breath, wheezing or hemoptysis.  CARDIOVASCULAR: No chest pain, orthopnea, edema.  GASTROINTESTINAL: No nausea, vomiting, diarrhea or abdominal pain.  GENITOURINARY: No dysuria, hematuria.  ENDOCRINE: No polyuria, nocturia,  HEMATOLOGY: No anemia, easy bruising or bleeding SKIN: No rash or lesion. MUSCULOSKELETAL: No joint pain or arthritis.   NEUROLOGIC: No tingling, numbness, weakness.  PSYCHIATRY: No anxiety or depression.   DRUG ALLERGIES:  No Known Allergies  VITALS:  Blood pressure 160/61, pulse 104, temperature 99.2 F (37.3 C), temperature source Oral, resp. rate 18, height  (1.727 m), weight 208 lb 1.6 oz (94.394 kg), SpO2 98 %.  PHYSICAL EXAMINATION:  GENERAL:  75 y.o.-year-old patient lying in the bed with no acute distress. Obese. EYES: Pupils equal, round, reactive to light and accommodation. No scleral icterus. Extraocular muscles intact.  HEENT: Head atraumatic, normocephalic. Oropharynx and nasopharynx clear.  NECK:  Supple, no jugular venous distention. No thyroid enlargement, no tenderness.  LUNGS: Normal breath sounds bilaterally, no wheezing, rales,rhonchi or crepitation. No use of accessory muscles of respiration.  CARDIOVASCULAR: S1, S2 normal. No murmurs, rubs, or gallops.  ABDOMEN: Soft, nontender, nondistended. Bowel sounds present. No organomegaly or mass.  EXTREMITIES: No pedal edema, cyanosis, or clubbing.  NEUROLOGIC: Cranial nerves II through XII are intact. Muscle strength 5/5 in all extremities. Sensation intact. Gait  not checked.  PSYCHIATRIC: The patient is alert and oriented x 3.  SKIN: No obvious rash, lesion, or ulcer.    LABORATORY PANEL:   CBC  Recent Labs Lab 03/02/16 0410  WBC 19.5*  HGB 10.6*  HCT 32.1*  PLT 299   ------------------------------------------------------------------------------------------------------------------  Chemistries   Recent Labs Lab 03/01/16 1630 03/02/16 0410  NA 138 139  K 3.6 3.5  CL 102 106  CO2 29 26  GLUCOSE 115* 139*  BUN 15 15  CREATININE 1.21* 1.21*  CALCIUM 9.8 8.6*  AST 48*  --   ALT 50  --   ALKPHOS 108  --   BILITOT 0.7  --    ------------------------------------------------------------------------------------------------------------------  Cardiac Enzymes No results for input(s): TROPONINI in the last 168 hours. ------------------------------------------------------------------------------------------------------------------  RADIOLOGY:  Ct Abdomen Pelvis W Contrast  03/01/2016  CLINICAL DATA:  Right lower quadrant abdominal pain with fever and chills. Previous hysterectomy. EXAM: CT ABDOMEN AND PELVIS WITH CONTRAST TECHNIQUE: Multidetector CT imaging of the abdomen and pelvis was performed using the standard protocol following bolus administration of intravenous contrast. CONTRAST:  ISOVUE-300 IOPAMIDOL (ISOVUE-300) INJECTION 61% COMPARISON:  Abdominal ultrasound 04/27/2010. FINDINGS: Lower chest: Trace pleural effusion on the right with minimal bibasilar atelectasis. Hepatobiliary: There is branching low-density within the left hepatic lobe consistent with partial portal vein thrombosis. The portal vein is patent in the right lobe. No focal hepatic parenchymal lesions are present. The gallbladder is incompletely distended. There is no surrounding inflammation or biliary dilatation. Pancreas: Unremarkable. No pancreatic ductal dilatation or surrounding inflammatory changes. Spleen: Normal in size without focal abnormality.  Adrenals/Urinary Tract: Both adrenal glands appear normal. The kidneys appear normal without evidence of urinary tract calculus, suspicious lesion  or hydronephrosis. No bladder abnormalities are seen. Stomach/Bowel: No evidence of bowel wall thickening, distention or surrounding inflammatory change. There is prominent fat deposition within the wall of the right colon. The terminal ileum appears normal. The appendix is probably visualized on the coronal images and appears normal. There are diverticular changes throughout the descending and sigmoid colon. Vascular/Lymphatic: Small lymph nodes in the porta hepatis are not pathologically enlarged. There is no retroperitoneal lymphadenopathy. There are few scattered prominent mesenteric lymph nodes. As above, there is partial portal vein thrombosis within the left lobe of the liver. The main portal vein, superior mesenteric and splenic veins are patent. Mild aortic and branch vessel atherosclerosis. Reproductive: Hysterectomy.  No adnexal mass. Other: Bilateral inguinal hernias containing fat. No herniated bowel. Musculoskeletal: No acute or significant osseous findings. There is an 8 mm nonspecific sclerotic lesion in the right aspect of the L3 vertebral body (image number 40). No lytic lesion. IMPRESSION: 1. 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 The main portal vein is patent. No other evidence of vascular occlusion. 2. Prominent fat deposition in the wall of the right colon as can be seen with chronic inflammation. No acute inflammation identified. The appendix appears normal. 3. Distal colonic diverticulosis without surrounding inflammation. 4. I attempted to personally call these results twice on 03/01/2016 at 6:56 pm to Dr. Maurilio LovelyNOELLE MCLAURIN , but was not able to reach her. These results will be called to the ordering clinician or representative by the Radiologist Assistant, and communication documented in the PACS or zVision Dashboard.  Electronically Signed   By: Carey BullocksWilliam  Veazey M.D.   On: 03/01/2016 18:58   Dg Chest Port 1 View  03/01/2016  CLINICAL DATA:  Fever and chills, fever of 103.4 degrees EXAM: PORTABLE CHEST 1 VIEW COMPARISON:  None. FINDINGS: The heart size and vascular pattern are normal. There is no consolidation or effusion. There is uncoiling of the aorta. IMPRESSION: No active disease. Electronically Signed   By: Esperanza Heiraymond  Rubner M.D.   On: 03/01/2016 16:43    EKG:   Orders placed or performed during the hospital encounter of 03/01/16  . ED EKG 12-Lead  . ED EKG 12-Lead    ASSESSMENT AND PLAN:   Karsten FellsBarbara Keating is a 75 y.o. female with a known history of Hypertension, insulin-dependent diabetes mellitus, hyperlipidemia comes from home secondary to fevers and chills.  #1 Sepsis due to bacteremia.  CXR: no pneumonia UA is normal.  blood cultures: E Coli. discontinue rocephin. Start meropenem PTD. Follow up CBC and B/C sensitivity.  #2 Partial portal vein thrombosis.  Follow up hypercoagulable work up and GI consult Continue IV heparin. Since no cirrhosis, likely xarelto or eliquis can be used for discharge  #3 DM- discontinue glipizide, continue lantus and sliding scale insulin  #4 HTN- on norvasc and lisinopril     All the records are reviewed and case discussed with Care Management/Social Workerr. Management plans discussed with the patient, her 2 daughters and they are in agreement.  CODE STATUS: full code.  TOTAL TIME TAKING CARE OF THIS PATIENT: 41 minutes.  Greater than 50% time was spent on coordination of care and face-to-face counseling.  POSSIBLE D/C IN 3 DAYS, DEPENDING ON CLINICAL CONDITION.   Shaune Pollackhen, Hava Massingale M.D on 03/02/2016 at 1:28 PM  Between 7am to 6pm - Pager - [phone]  After 6pm go to [email or web address] - password [email or web address] [facility]  [facility]  Office  [phone]  CC: Primary care physician; No primary care provider on  file.

## 2016-03-02 NOTE — Progress Notes (Signed)
ANTICOAGULATION CONSULT NOTE - Initial Consult  Pharmacy Consult for Heparin Drip Indication: portal vein thrombosis  No Known Allergies  Patient Measurements: Height: 5\' 8"  (172.7 cm) Weight: 208 lb 1.6 oz (94.394 kg) IBW/kg (Calculated) : 63.9 Heparin Dosing Weight: 84  Vital Signs: Temp: 99.3 F (37.4 C) (05/26 1352) Temp Source: Oral (05/26 1352) BP: 144/62 mmHg (05/26 1352) Pulse Rate: 105 (05/26 1352)  Labs:  Recent Labs  03/01/16 1630 03/01/16 1650 03/02/16 0410 03/02/16 1306  HGB 11.5*  --  10.6*  --   HCT 34.6*  --  32.1*  --   PLT 315  --  299  --   APTT  --  28  --   --   LABPROT  --  13.8  --   --   INR  --  1.04  --   --   HEPARINUNFRC  --   --  0.25* 0.19*  CREATININE 1.21*  --  1.21*  --     Estimated Creatinine Clearance: 49.7 mL/min (by C-G formula based on Cr of 1.21).   Assessment: Pharmacy consulted to dose heparin drip in a 75 yo female with portal vein thrombosis.     Goal of Therapy:  Heparin level 0.3-0.7 units/ml Monitor platelets by anticoagulation protocol: Yes   Plan:  Current orders for heparin 1550 units/hr. Per RN heparin running without any problems. Heparin level still subtherapeutic  Heparin bolus 2500 units x 1, increase drip to 1800 units/hr  Recheck heparin level in 8 hours, CBC with AM labs.    Sakira Dahmer C, Pharm.D., Clinical Pharmacist 03/02/2016,2:16 PM

## 2016-03-02 NOTE — Consult Note (Signed)
Consultation  Referring Provider: Dr. Nemiah Commander Primary Care Physician:  No primary care provider on file. Consulting  Gastroenterologist:    Dr. Lynnae Prude     Reason for Consultation: Portal Vein Thrombosis/No cirrhosis            HPI:   Becky Sutton is a 75 y.o. female with a known history of hypertension,diabetes mellitus, hyperlipidemia presented to the ED with one day history of severe chills described as rigor,  fever, and pain in the bilateral lower abdomen. On Sunday 5/21 she passed slight blood tinged urine without dysuria. On Wed 5/24 she felt cold, but no other symptoms. Yesterday, at 1pm she developed abrupt rigor, fever of 103 , and sever bilateral lower crampy pain rated 9 out of 10 with radiation into the lower back. Normal formed BM yesterday-no bloody stools.  Patient has never had pain like this. She thought it was a urinary tract infection or a kidney [place].   She knew that something bad was going on so she presented to the emergency room. Chest x-ray is clear. CT of the abdomen showing partially occlusive portal vein thrombosis in the left hepatic lobe, no cirrhosis. Blood culture is positive for E coli. She received Rocephin and is on Merrem. She is on heparin drip. Her fever is reduced and her pain in the lower abdomen is gone. She has soreness with palpation RUQ. She has no N/V and attempted to a little yest and today. Poor appetite. No weight loss or illness before this event. No known liver disease. No ETOH, travel, new medication. Negative FH liver dx except in an alcoholic brother. Another brother died of throat cancer.  No FH of clotting disorder.   She reports an EGD and colonoscopy about 2 years ago reportedly normal from [place]-records not in Care Everywhere.    Past Medical History  Diagnosis Date  . Diabetes mellitus without complication (HCC)   . Hypertension   . Hyperlipidemia     Past Surgical History  Procedure Laterality Date  . Abdominal  hysterectomy    . Hemorrhoid surgery    . Cataract surgery      Family History  Problem Relation Age of Onset  . DM Mother Deceased 87  . Natural Causes Father Deceased 43     Social History  Substance Use Topics  . Smoking status: Never Smoker   . Smokeless tobacco: None  . Alcohol Use: No    Prior to Admission medications   Medication Sig Start Date End Date Taking? Authorizing Provider  amLODipine (NORVASC) 10 MG tablet Take 10 mg by mouth daily.   Yes Historical Provider, MD  aspirin EC 81 MG tablet Take 81 mg by mouth daily.   Yes Historical Provider, MD  furosemide (LASIX) 20 MG tablet Take 20 mg by mouth daily.   Yes Historical Provider, MD  glipiZIDE (GLUCOTROL XL) 10 MG 24 hr tablet Take 10 mg by mouth 2 (two) times daily.   Yes Historical Provider, MD  insulin aspart protamine - aspart (NOVOLOG MIX 70/30 FLEXPEN) (70-30) 100 UNIT/ML FlexPen Inject 20 Units into the skin 2 (two) times daily.   Yes Historical Provider, MD  quinapril (ACCUPRIL) 40 MG tablet Take 40 mg by mouth daily.   Yes Historical Provider, MD  simvastatin (ZOCOR) 20 MG tablet Take 20 mg by mouth daily.   Yes Historical Provider, MD    Current Facility-Administered Medications  Medication Dose Route Frequency Provider Last Rate Last Dose  . 0.9 %  sodium chloride infusion   Intravenous Continuous Enid Baas, MD 75 mL/hr at 03/02/16 1236    . acetaminophen (TYLENOL) tablet 650 mg  650 mg Oral Q6H PRN Enid Baas, MD       Or  . acetaminophen (TYLENOL) suppository 650 mg  650 mg Rectal Q6H PRN Enid Baas, MD      . amLODipine (NORVASC) tablet 10 mg  10 mg Oral Daily Enid Baas, MD   10 mg at 03/02/16 1011  . docusate sodium (COLACE) capsule 100 mg  100 mg Oral BID Enid Baas, MD   100 mg at 03/02/16 1011  . heparin ADULT infusion 100 units/mL (25000 units/257mL sodium chloride 0.45%)  1,550 Units/hr Intravenous Continuous Enid Baas, MD 15.5 mL/hr at 03/02/16 0505  1,550 Units/hr at 03/02/16 0505  . insulin aspart (novoLOG) injection 0-5 Units  0-5 Units Subcutaneous QHS Enid Baas, MD   2 Units at 03/01/16 2333  . insulin aspart (novoLOG) injection 0-9 Units  0-9 Units Subcutaneous TID WC Enid Baas, MD   0 Units at 03/02/16 0826  . insulin aspart protamine- aspart (NOVOLOG MIX 70/30) injection 20 Units  20 Units Subcutaneous BID WC Enid Baas, MD   20 Units at 03/02/16 1610  . lisinopril (PRINIVIL,ZESTRIL) tablet 40 mg  40 mg Oral Daily Enid Baas, MD   40 mg at 03/02/16 1011  . meropenem (MERREM) 1 g in sodium chloride 0.9 % 100 mL IVPB  1 g Intravenous Q8H Shaune Pollack, MD   1 g at 03/02/16 0940  . morphine 2 MG/ML injection 2 mg  2 mg Intravenous Q4H PRN Enid Baas, MD      . ondansetron (ZOFRAN) tablet 4 mg  4 mg Oral Q6H PRN Enid Baas, MD       Or  . ondansetron (ZOFRAN) injection 4 mg  4 mg Intravenous Q6H PRN Enid Baas, MD      . oxyCODONE (Oxy IR/ROXICODONE) immediate release tablet 5 mg  5 mg Oral Q4H PRN Enid Baas, MD      . polyethylene glycol (MIRALAX / GLYCOLAX) packet 17 g  17 g Oral Daily PRN Enid Baas, MD      . simvastatin (ZOCOR) tablet 20 mg  20 mg Oral Daily Enid Baas, MD   20 mg at 03/02/16 1011    Allergies as of 03/01/2016  . (No Known Allergies)     Review of Systems:    A 12 system review was obtained and all negative except where noted in HPI.Positive slight sinus drainage and chronic rare cough. No active pulmonary concerns.     Physical Exam:  Vital signs in last 24 hours: Temp:  [98.5 F (36.9 C)-102.6 F (39.2 C)] 99.2 F (37.3 C) (05/26 0930) Pulse Rate:  [90-131] 104 (05/26 0930) Resp:  [9-22] 18 (05/26 0930) BP: (131-163)/(53-69) 160/61 mmHg (05/26 0930) SpO2:  [96 %-100 %] 98 % (05/26 0930) Weight:  [92.1 kg (203 lb 0.7 oz)-94.394 kg (208 lb 1.6 oz)] 94.394 kg (208 lb 1.6 oz) (05/25 2159) Last BM Date: 03/01/16  General:   Well-developed, well-nourished and in no acute distress Head:  Head without obvious abnormality, atraumatic  Eyes:   Conjunctiva pink, sclera anicteric   ENT:   Mouth free of lesions, mucosa moist, tongue pink, no thrush noted, teeth and gums normal Neck:   Supple w/o thyromegaly or mass, trachea midline, no adenopathy  Lungs: Clear to auscultation bilaterally, respirations unlabored Heart:     Normal S1S2, no rubs, murmurs, gallops. Abdomen:  Soft, slight tender RUQ, none in lower abd, no hepatosplenomegaly, hernia, or mass and BS normal Rectal: Deferred Lymph:  No cervical or supraclavicular adenopathy. Extremities:   No edema, cyanosis, or clubbing Skin  Skin color, texture, turgor normal, no rashes or lesions Neuro:  A&O x 3. CNII-XII intact, normal strength Psych:  Appropriate mood and affect.  Data Reviewed:  LAB RESULTS:  Recent Labs  03/01/16 1630 03/02/16 0410  WBC 14.9* 19.5*  HGB 11.5* 10.6*  HCT 34.6* 32.1*  PLT 315 299   BMET  Recent Labs  03/01/16 1630 03/02/16 0410  NA 138 139  K 3.6 3.5  CL 102 106  CO2 29 26  GLUCOSE 115* 139*  BUN 15 15  CREATININE 1.21* 1.21*  CALCIUM 9.8 8.6*   LFT  Recent Labs  03/01/16 1630  PROT 7.7  ALBUMIN 3.5  AST 48*  ALT 50  ALKPHOS 108  BILITOT 0.7   PT/INR  Recent Labs  03/01/16 1650  LABPROT 13.8  INR 1.04   LIPASE 16, Hgb A1C 6.9, APTT baseline 28, Lactic Acid 0.8, Bld Cx pos E coli  STUDIES: Ct Abdomen Pelvis W Contrast  03/01/2016  CLINICAL DATA:  Right lower quadrant abdominal pain with fever and chills. Previous hysterectomy. EXAM: CT ABDOMEN AND PELVIS WITH CONTRAST TECHNIQUE: Multidetector CT imaging of the abdomen and pelvis was performed using the standard protocol following bolus administration of intravenous contrast. CONTRAST:  100mL ISOVUE-300 IOPAMIDOL (ISOVUE-300) INJECTION 61% COMPARISON:  Abdominal ultrasound 04/27/2010. FINDINGS: Lower chest: Trace pleural effusion on the right with  minimal bibasilar atelectasis. Hepatobiliary: There is branching low-density within the left hepatic lobe consistent with partial portal vein thrombosis. The portal vein is patent in the right lobe. No focal hepatic parenchymal lesions are present. The gallbladder is incompletely distended. There is no surrounding inflammation or biliary dilatation. Pancreas: Unremarkable. No pancreatic ductal dilatation or surrounding inflammatory changes. Spleen: Normal in size without focal abnormality. Adrenals/Urinary Tract: Both adrenal glands appear normal. The kidneys appear normal without evidence of urinary tract calculus, suspicious lesion or hydronephrosis. No bladder abnormalities are seen. Stomach/Bowel: No evidence of bowel wall thickening, distention or surrounding inflammatory change. There is prominent fat deposition within the wall of the right colon. The terminal ileum appears normal. The appendix is probably visualized on the coronal images and appears normal. There are diverticular changes throughout the descending and sigmoid colon. Vascular/Lymphatic: Small lymph nodes in the porta hepatis are not pathologically enlarged. There is no retroperitoneal lymphadenopathy. There are few scattered prominent mesenteric lymph nodes. As above, there is partial portal vein thrombosis within the left lobe of the liver. The main portal vein, superior mesenteric and splenic veins are patent. Mild aortic and branch vessel atherosclerosis. Reproductive: Hysterectomy.  No adnexal mass. Other: Bilateral inguinal hernias containing fat. No herniated bowel. Musculoskeletal: No acute or significant osseous findings. There is an 8 mm nonspecific sclerotic lesion in the right aspect of the L3 vertebral body (image number 40). No lytic lesion. IMPRESSION: 1. 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 The main portal vein is patent. No other evidence of vascular occlusion. 2. Prominent fat deposition in the wall of  the right colon as can be seen with chronic inflammation. No acute inflammation identified. The appendix appears normal. 3. Distal colonic diverticulosis without surrounding inflammation. 4. I attempted to personally call these results twice on 03/01/2016 at 6:56 pm to Dr. Maurilio LovelyNOELLE MCLAURIN , but was not able to reach her. These results will be called  to the ordering clinician or representative by the Radiologist Assistant, and communication documented in the PACS or zVision Dashboard. Electronically Signed   By: Carey Bullocks M.D.   On: 03/01/2016 18:58   Dg Chest Port 1 View  03/01/2016  CLINICAL DATA:  Fever and chills, fever of 103.4 degrees EXAM: PORTABLE CHEST 1 VIEW COMPARISON:  None. FINDINGS: The heart size and vascular pattern are normal. There is no consolidation or effusion. There is uncoiling of the aorta. IMPRESSION: No active disease. Electronically Signed   By: Esperanza Heir M.D.   On: 03/01/2016 16:43   Assessment:  ECKO BEASLEY is a 75 y.o. with a known history of hypertension,diabetes mellitus, hyperlipidemia presented to the ED with one day history of severe chills described as rigor, fever, and pain in the bilateral lower abdomen. She had leukocytosis, E coli in blood culture and partial portal vein thrombosis within the left hepatic lobe.THis suggest septic or acute pylephlebitis. No evidence of intestinal infarction.The main portal vein is patent. No other evidence of vascular occlusion   2. Severe lower abd pain, fever, rigor have all improved on heparin and antibiotics. No evidence of HCC, pancreatic tumor or pancreatitis, cirrhosis, or ischemic bowel on CT. The cause of her PVT is unknown  and etiologies to be considered are invasion of portal vein by malignancy, doubt cirrhosis, R/o hypercoagulable state, infection.  Plan:  1. [place] of liver with doppler to look closely at the bile ducts. Check for signs of malignancy such  PV diameter >23 mm, enhancement of endoluminal  material during the arterial phase of contrast injection, arterial-like pulsatile flow seen with doppler US, disruption of vessel walls. Consider AFP lab- no HCC on CT. 2. Anticoagulation with Heparin in process to prevent extension of the clot and allow recanalization to avoid intestinal ischemia and portal hypertension. She does not have a hx of cirrhosis and reports a previous negative EGD. She was never told that she has esophageal varices. 3. She is showing improvement with less pain, resolving fever and leukocytosis on Abx. Positive blood culture E coli likely from urinary source. She reported pink urine- UA unremarkable and Cx is pending.  4.  Prominent fat deposition in the wall of the right colon as can be seen with chronic inflammation. No acute inflammation identified. The appendix appears normal. Distal colonic diverticulosis without surrounding inflammation. She reports a negative colonoscopy 2 years ago.   This case was discussed with Dr. Scot Jun in collaboration of care. Thank you for the consultation.  These services provided by Amedeo Kinsman RN, MSN, ANP-BC under collaborative practice agreement with Scot Jun, MD.  03/02/2016, 1:16 PM

## 2016-03-02 NOTE — Care Management (Signed)
Admitted to [facility] with the diagnosis of sepsis. Lives with daughter, Mervin Kungrnetta Edwards [phone]). Last seen Dr. Butler DenmarkWroth at [facility] 02/17/16. Takes care of all basic and instrumental activities of daily living herself, drives. No falls. Good appetite. Uses no aids for ambulation. Family will transport. Gwenette GreetBrenda S Marien Manship RN MSN CCM Care Management [phone]

## 2016-03-03 ENCOUNTER — Inpatient Hospital Stay: Payer: Medicare Other

## 2016-03-03 LAB — CBC
HCT: 27.8 % — ABNORMAL LOW (ref 35.0–47.0)
HEMOGLOBIN: 9.1 g/dL — AB (ref 12.0–16.0)
MCH: 28.1 pg (ref 26.0–34.0)
MCHC: 32.9 g/dL (ref 32.0–36.0)
MCV: 85.6 fL (ref 80.0–100.0)
PLATELETS: 247 10*3/uL (ref 150–440)
RBC: 3.24 MIL/uL — AB (ref 3.80–5.20)
RDW: 13.7 % (ref 11.5–14.5)
WBC: 12.6 10*3/uL — ABNORMAL HIGH (ref 3.6–11.0)

## 2016-03-03 LAB — GLUCOSE, CAPILLARY
GLUCOSE-CAPILLARY: 74 mg/dL (ref 65–99)
GLUCOSE-CAPILLARY: 77 mg/dL (ref 65–99)
GLUCOSE-CAPILLARY: 107 mg/dL — AB (ref 65–99)
GLUCOSE-CAPILLARY: 158 mg/dL — AB (ref 65–99)
Glucose-Capillary: 56 mg/dL — ABNORMAL LOW (ref 65–99)
Glucose-Capillary: 59 mg/dL — ABNORMAL LOW (ref 65–99)
Glucose-Capillary: 67 mg/dL (ref 65–99)
Glucose-Capillary: 112 mg/dL — ABNORMAL HIGH (ref 65–99)
Glucose-Capillary: 172 mg/dL — ABNORMAL HIGH (ref 65–99)

## 2016-03-03 LAB — HEPARIN LEVEL (UNFRACTIONATED): HEPARIN UNFRACTIONATED: 0.56 [IU]/mL (ref 0.30–0.70)

## 2016-03-03 LAB — URINE CULTURE: Culture: NO GROWTH

## 2016-03-03 MED ORDER — DEXTROSE 50 % IV SOLN
INTRAVENOUS | Status: AC
  Administered 2016-03-03: 09:00:00 50 mL
  Filled 2016-03-03: qty 50

## 2016-03-03 MED ORDER — DEXTROSE 50 % IV SOLN: 25.0000 mL | Freq: Once | INTRAVENOUS | Status: AC

## 2016-03-03 NOTE — Progress Notes (Signed)
[facility] -  at [facility]   PATIENT NAME: Becky FellsBarbara Sutton    MR#:  [id number]  DATE OF BIRTH:  10/07/1941  SUBJECTIVE:  CHIEF COMPLAINT:   Chief Complaint  Patient presents with  . Fever   Admitted with abd pain  REVIEW OF SYSTEMS:  CONSTITUTIONAL: Fever    CARDIOVASCULAR: No chest pain,   GASTROINTESTINAL: No abdominal pain.    DRUG ALLERGIES:  No Known Allergies  VITALS:  Blood pressure 127/56, pulse 94, temperature 98.2 F (36.8 C), temperature source Oral, resp. rate 16, height 5\' 8"  (1.727 m), weight 94.394 kg (208 lb 1.6 oz), SpO2 100 %.  PHYSICAL EXAMINATION:  GENERAL:  75 y.o.-year-old with no acute distress. Obese. EYES: Pupils equal, round, reactive to light and accommodation. No scleral icterus. HEENT: Head atraumatic, normocephalic. Oropharynx and nasopharynx clear.  NECK:  Supple, no jugular venous distention.  LUNGS:CTA, no wheezing, rales,rhonchi or crepitation. No use of accessory muscles of respiration.No dullness to percussion.  CARDIOVASCULAR: S1, S2 normal. No murmurs, rubs, or gallops.  ABDOMEN: Soft, nontender, nondistended. Bowel sounds present. No organomegaly or mass.  EXTREMITIES: Mild LE edema  NEUROLOGIC: Cranial nerves II through XII are intact. Muscle strength 5/5 in all extremities. Alert and oriented x 4   SKIN: No obvious rash, lesion, or ulcer.    LABORATORY PANEL:   CBC  Recent Labs Lab 03/03/16 0701  WBC 12.6*  HGB 9.1*  HCT 27.8*  PLT 247   ------------------------------------------------------------------------------------------------------------------  Chemistries   Recent Labs Lab 03/01/16 1630 03/02/16 0410  NA 138 139  K 3.6 3.5  CL 102 106  CO2 29 26  GLUCOSE 115* 139*  BUN 15 15  CREATININE 1.21* 1.21*  CALCIUM 9.8 8.6*  AST 48*  --   ALT 50  --   ALKPHOS 108  --   BILITOT 0.7  --     ------------------------------------------------------------------------------------------------------------------  Cardiac Enzymes No results for input(s): TROPONINI in the last 168 hours. ------------------------------------------------------------------------------------------------------------------  RADIOLOGY:  Ct Abdomen Pelvis W Contrast  03/01/2016  CLINICAL DATA:  Right lower quadrant abdominal pain with fever and chills. Previous hysterectomy. EXAM: CT ABDOMEN AND PELVIS WITH CONTRAST TECHNIQUE: Multidetector CT imaging of the abdomen and pelvis was performed using the standard protocol following bolus administration of intravenous contrast. CONTRAST:  100mL ISOVUE-300 IOPAMIDOL (ISOVUE-300) INJECTION 61% COMPARISON:  Abdominal ultrasound 04/27/2010. FINDINGS: Lower chest: Trace pleural effusion on the right with minimal bibasilar atelectasis. Hepatobiliary: There is branching low-density within the left hepatic lobe consistent with partial portal vein thrombosis. The portal vein is patent in the right lobe. No focal hepatic parenchymal lesions are present. The gallbladder is incompletely distended. There is no surrounding inflammation or biliary dilatation. Pancreas: Unremarkable. No pancreatic ductal dilatation or surrounding inflammatory changes. Spleen: Normal in size without focal abnormality. Adrenals/Urinary Tract: Both adrenal glands appear normal. The kidneys appear normal without evidence of urinary tract calculus, suspicious lesion or hydronephrosis. No bladder abnormalities are seen. Stomach/Bowel: No evidence of bowel wall thickening, distention or surrounding inflammatory change. There is prominent fat deposition within the wall of the right colon. The terminal ileum appears normal. The appendix is probably visualized on the coronal images and appears normal. There are diverticular changes throughout the descending and sigmoid colon. Vascular/Lymphatic: Small lymph nodes in the  porta hepatis are not pathologically enlarged. There is no retroperitoneal lymphadenopathy. There are few scattered prominent mesenteric lymph nodes. As above, there is partial portal vein thrombosis within the left lobe of the liver. The  main portal vein, superior mesenteric and splenic veins are patent. Mild aortic and branch vessel atherosclerosis. Reproductive: Hysterectomy.  No adnexal mass. Other: Bilateral inguinal hernias containing fat. No herniated bowel. Musculoskeletal: No acute or significant osseous findings. There is an 8 mm nonspecific sclerotic lesion in the right aspect of the L3 vertebral body (image number 40). No lytic lesion. IMPRESSION: 1. 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 The main portal vein is patent. No other evidence of vascular occlusion. 2. Prominent fat deposition in the wall of the right colon as can be seen with chronic inflammation. No acute inflammation identified. The appendix appears normal. 3. Distal colonic diverticulosis without surrounding inflammation. 4. I attempted to personally call these results twice on 03/01/2016 at 6:56 pm to Dr. Maurilio Lovely , but was not able to reach her. These results will be called to the ordering clinician or representative by the Radiologist Assistant, and communication documented in the PACS or zVision Dashboard. Electronically Signed   By: Carey Bullocks M.D.   On: 03/01/2016 18:58   [place] Art/ven Flow Abd Pelv Doppler  03/03/2016  CLINICAL DATA:  Partial left portal vein thrombosis EXAM: DUPLEX ULTRASOUND OF LIVER TECHNIQUE: Color and duplex Doppler ultrasound was performed to evaluate the hepatic in-flow and out-flow vessels. COMPARISON:  CT 03/01/2016 FINDINGS: Portal Vein 1.1 cm diameter. No ultrasound evidence of portal vein occlusion or thrombosis. Velocities (all hepatopetal): Main:  37-57 cm/sec Right:  37 cm/sec Left:  27 cm/sec Hepatic Vein Velocities (all hepatofugal): Right:  26 cm/sec Middle:  50  cm/sec Left:  34 cm/sec Intrahepatic IVC patent, velocity 39 cm/second Hepatic Artery Velocity:  182 Cm/sec Spleen 6.6 x 7.6 x 2.5 cm. No evidence of Splenic Vein occlusion or thrombus. Velocity: 13 cm/sec Varices: None seen Ascites: None seen IMPRESSION: 1. No ultrasound evidence of persistent left portal vein thrombus. Normal hepatic Doppler assessment. Electronically Signed   By: Corlis Leak M.D.   On: 03/03/2016 10:38   Dg Chest Port 1 View  03/01/2016  CLINICAL DATA:  Fever and chills, fever of 103.4 degrees EXAM: PORTABLE CHEST 1 VIEW COMPARISON:  None. FINDINGS: The heart size and vascular pattern are normal. There is no consolidation or effusion. There is uncoiling of the aorta. IMPRESSION: No active disease. Electronically Signed   By: Esperanza Heir M.D.   On: 03/01/2016 16:43    EKG:   Orders placed or performed during the hospital encounter of 03/01/16  . ED EKG 12-Lead  . ED EKG 12-Lead    ASSESSMENT AND PLAN:    #1 Sepsis due to bacteremia.: BC growing E.coli. Source unknown. Urine still shows clean. Awaiting sensativities. On meropenem.    #2 Partial portal vein thrombosis: [place] from this morning shows thrombus no longer present. Adb pain resolved. Etiology unknown. Hypercoagulabilty workup in progress. Will need to decide how long she will need anticoagulation if test do not reveal anything.  #3 Bacteremia: Source unknown. Growing E. Coli. Awaiting sensativities. On meropenem.   #3 DM- discontinue glipizide, continue lantus and sliding scale insulin  #4 HTN-Controlled on norvasc and lisinopril      CODE STATUS: full code.  TOTAL TIME TAKING CARE OF THIS PATIENT: 30 minutes.    POSSIBLE D/C IN 2 DAYS, DEPENDING ON CLINICAL CONDITION.   Alta Corning.D on 03/03/2016 at 12:16 PM  Between 7am to 6pm - Pager - [phone]  After 6pm go to [email or web address] - password [facility]  Office  [phone]  CC: Primary care physician;  No primary care provider on file.

## 2016-03-03 NOTE — Progress Notes (Addendum)
ANTICOAGULATION CONSULT NOTE - Follow Up Consult  Pharmacy Consult for Heparin Drip Indication: portal vein thrombosis  No Known Allergies  Patient Measurements: Height: 5\' 8"  (172.7 cm) Weight: 208 lb 1.6 oz (94.394 kg) IBW/kg (Calculated) : 63.9 Heparin Dosing Weight: 84  Vital Signs: Temp: 98.2 F (36.8 C) (05/27 0542) Temp Source: Oral (05/27 0542) BP: 127/56 mmHg (05/27 0542) Pulse Rate: 94 (05/27 0542)  Labs:  Recent Labs  03/01/16 1630 03/01/16 1650  03/02/16 0410 03/02/16 1306 03/02/16 2312 03/03/16 0701  HGB 11.5*  --   --  10.6*  --   --  9.1*  HCT 34.6*  --   --  32.1*  --   --  27.8*  PLT 315  --   --  299  --   --  247  APTT  --  28  --   --   --   --   --   LABPROT  --  13.8  --   --   --   --   --   INR  --  1.04  --   --   --   --   --   HEPARINUNFRC  --   --   < > 0.25* 0.19* 0.43 0.56  CREATININE 1.21*  --   --  1.21*  --   --   --   < > = values in this interval not displayed.  Estimated Creatinine Clearance: 49.7 mL/min (by C-G formula based on Cr of 1.21).   Assessment: Pharmacy consulted to dose heparin drip in a 75 yo female with portal vein thrombosis.    5/26 - HL at 2312: 0.43 5/27- HL at 0701: 0.56   Goal of Therapy:  Heparin level 0.3-0.7 units/ml Monitor platelets by anticoagulation protocol: Yes   Plan:  Heparin level this AM is within desired range.  This is the second level within therapeutic range.  Therefore, will transition to daily HL and CBC.   Pharmacy will continue to follow.   Clarisa Schoolsrystal Scarpena, PharmD Clinical Pharmacist 03/03/2016  ADD:   RN called stating that she discovered that patient did not have Heparin running for ~ 3 hours. Will follow up on Heparin levels in am.

## 2016-03-03 NOTE — Progress Notes (Signed)
ANTICOAGULATION CONSULT NOTE - Initial Consult  Pharmacy Consult for Heparin Drip Indication: portal vein thrombosis  No Known Allergies  Patient Measurements: Height: 5\' 8"  (172.7 cm) Weight: 208 lb 1.6 oz (94.394 kg) IBW/kg (Calculated) : 63.9 Heparin Dosing Weight: 84  Vital Signs: Temp: 99.7 F (37.6 C) (05/26 2032) Temp Source: Oral (05/26 2032) BP: 147/56 mmHg (05/26 2032) Pulse Rate: 105 (05/26 2032)  Labs:  Recent Labs  03/01/16 1630 03/01/16 1650 03/02/16 0410 03/02/16 1306 03/02/16 2312  HGB 11.5*  --  10.6*  --   --   HCT 34.6*  --  32.1*  --   --   PLT 315  --  299  --   --   APTT  --  28  --   --   --   LABPROT  --  13.8  --   --   --   INR  --  1.04  --   --   --   HEPARINUNFRC  --   --  0.25* 0.19* 0.43  CREATININE 1.21*  --  1.21*  --   --     Estimated Creatinine Clearance: 49.7 mL/min (by C-G formula based on Cr of 1.21).   Assessment: Pharmacy consulted to dose heparin drip in a 75 yo female with portal vein thrombosis.     Goal of Therapy:  Heparin level 0.3-0.7 units/ml Monitor platelets by anticoagulation protocol: Yes   Plan:  Heparin level therapeutic x 1. Continue current rate. Will recheck level in 8 hours.  Carola FrostNathan A Kendrik Mcshan, Pharm.D., BCPS Clinical Pharmacist 03/03/2016,12:04 AM

## 2016-03-03 NOTE — Consult Note (Signed)
GI Follow Up Note  Referring Provider: Dr. Nemiah CommanderKalisetti Reason for Consultation: Portal Vein Thrombosis/No cirrhosis           Subjective   Becky Sutton is a 75 y.o. female with a known history of hypertension,diabetes mellitus, hyperlipidemia presented to the ED with one day history of severe chills described as rigor,  fever, and pain in the bilateral lower abdomen. She was found to have a portal vein thrombus. She feels much better this am.  Prior to Admission medications   Medication Sig Start Date End Date Taking? Authorizing Provider  amLODipine (NORVASC) 10 MG tablet Take 10 mg by mouth daily.   Yes Historical Provider, MD  aspirin EC 81 MG tablet Take 81 mg by mouth daily.   Yes Historical Provider, MD  furosemide (LASIX) 20 MG tablet Take 20 mg by mouth daily.   Yes Historical Provider, MD  glipiZIDE (GLUCOTROL XL) 10 MG 24 hr tablet Take 10 mg by mouth 2 (two) times daily.   Yes Historical Provider, MD  insulin aspart protamine - aspart (NOVOLOG MIX 70/30 FLEXPEN) (70-30) 100 UNIT/ML FlexPen Inject 20 Units into the skin 2 (two) times daily.   Yes Historical Provider, MD  quinapril (ACCUPRIL) 40 MG tablet Take 40 mg by mouth daily.   Yes Historical Provider, MD  simvastatin (ZOCOR) 20 MG tablet Take 20 mg by mouth daily.   Yes Historical Provider, MD    Current Facility-Administered Medications  Medication Dose Route Frequency Provider Last Rate Last Dose  . acetaminophen (TYLENOL) tablet 650 mg  650 mg Oral Q6H PRN [name], MD       Or  . acetaminophen (TYLENOL) suppository 650 mg  650 mg Rectal Q6H PRN [name], MD      . amLODipine (NORVASC) tablet 10 mg  10 mg Oral Daily [name], MD   10 mg at 03/03/16 1258  . docusate sodium (COLACE) capsule 100 mg  100 mg Oral BID [name], MD   100 mg at 03/02/16 1011  . heparin ADULT infusion 100 units/mL (25000 units/27450mL sodium chloride 0.45%)  1,800 Units/hr Intravenous Continuous Shaune PollackQing Chen, MD 18  mL/hr at 03/03/16 0418 1,800 Units/hr at 03/03/16 0418  . insulin aspart (novoLOG) injection 0-5 Units  0-5 Units Subcutaneous QHS [name], MD   2 Units at 03/01/16 2333  . insulin aspart (novoLOG) injection 0-9 Units  0-9 Units Subcutaneous TID WC [name], MD   0 Units at 03/02/16 0826  . insulin aspart protamine- aspart (NOVOLOG MIX 70/30) injection 20 Units  20 Units Subcutaneous BID WC [name], MD   20 Units at 03/02/16 16100828  . lisinopril (PRINIVIL,ZESTRIL) tablet 40 mg  40 mg Oral Daily [name], MD   40 mg at 03/03/16 1258  . meropenem (MERREM) 1 g in sodium chloride 0.9 % 100 mL IVPB  1 g Intravenous Q8H Shaune PollackQing Chen, MD   1 g at 03/03/16 0534  . morphine 2 MG/ML injection 2 mg  2 mg Intravenous Q4H PRN [name], MD      . ondansetron (ZOFRAN) tablet 4 mg  4 mg Oral Q6H PRN [name], MD       Or  . ondansetron (ZOFRAN) injection 4 mg  4 mg Intravenous Q6H PRN [name], MD      . oxyCODONE (Oxy IR/ROXICODONE) immediate release tablet 5 mg  5 mg Oral Q4H PRN [name], MD      . polyethylene glycol (MIRALAX / GLYCOLAX) packet 17  g  17 g Oral Daily PRN Enid Baas, MD      . simvastatin (ZOCOR) tablet 20 mg  20 mg Oral Daily Enid Baas, MD   20 mg at 03/03/16 1259    Allergies as of 03/01/2016  . (No Known Allergies)      Physical Exam:  Vital signs in last 24 hours: Temp:  [97.9 F (36.6 C)-99.7 F (37.6 C)] 98.6 F (37 C) (05/27 1320) Pulse Rate:  [94-105] 98 (05/27 1320) Resp:  [16-20] 18 (05/27 1255) BP: (127-147)/(56-67) 142/59 mmHg (05/27 1320) SpO2:  [95 %-100 %] 96 % (05/27 1320) Last BM Date: 03/02/16  General:  Well-developed, well-nourished and in no acute distress Head:  Head without obvious abnormality, atraumatic  Eyes:   Conjunctiva pink, sclera anicteric   ENT:   Mouth free of lesions, mucosa moist, tongue pink, no thrush noted, teeth and gums normal Lungs: Clear to auscultation  bilaterally, respirations unlabored Heart:     Normal S1S2, no rubs, murmurs, gallops. Abdomen: Soft,nt, nd, no hepatosplenomegaly, hernia, or mass and BS normal Extremities:   No edema, cyanosis, or clubbing  Data Reviewed:  LAB RESULTS:  Recent Labs  03/02/16 0410 03/03/16 0701  WBC 19.5* 12.6*  HGB 10.6* 9.1*  HCT 32.1* 27.8*  PLT 299 247   BMET  Recent Labs  03/01/16 1630 03/02/16 0410  NA 138 139  K 3.6 3.5  CL 102 106  CO2 29 26  GLUCOSE 115* 139*  BUN 15 15  CREATININE 1.21* 1.21*  CALCIUM 9.8 8.6*   LFT  Recent Labs  03/01/16 1630  PROT 7.7  ALBUMIN 3.5  AST 48*  ALT 50  ALKPHOS 108  BILITOT 0.7   PT/INR  Recent Labs  03/01/16 1650  LABPROT 13.8  INR 1.04   LIPASE 16, Hgb A1C 6.9, APTT baseline 28, Lactic Acid 0.8, Bld Cx pos E coli  STUDIES: Ct Abdomen Pelvis W Contrast  03/01/2016  CLINICAL DATA:  Right lower quadrant abdominal pain with fever and chills. Previous hysterectomy. EXAM: CT ABDOMEN AND PELVIS WITH CONTRAST TECHNIQUE: Multidetector CT imaging of the abdomen and pelvis was performed using the standard protocol following bolus administration of intravenous contrast. CONTRAST:  ISOVUE-300 IOPAMIDOL (ISOVUE-300) INJECTION 61% COMPARISON:  Abdominal ultrasound 04/27/2010. FINDINGS: Lower chest: Trace pleural effusion on the right with minimal bibasilar atelectasis. Hepatobiliary: There is branching low-density within the left hepatic lobe consistent with partial portal vein thrombosis. The portal vein is patent in the right lobe. No focal hepatic parenchymal lesions are present. The gallbladder is incompletely distended. There is no surrounding inflammation or biliary dilatation. Pancreas: Unremarkable. No pancreatic ductal dilatation or surrounding inflammatory changes. Spleen: Normal in size without focal abnormality. Adrenals/Urinary Tract: Both adrenal glands appear normal. The kidneys appear normal without evidence of urinary tract  calculus, suspicious lesion or hydronephrosis. No bladder abnormalities are seen. Stomach/Bowel: No evidence of bowel wall thickening, distention or surrounding inflammatory change. There is prominent fat deposition within the wall of the right colon. The terminal ileum appears normal. The appendix is probably visualized on the coronal images and appears normal. There are diverticular changes throughout the descending and sigmoid colon. Vascular/Lymphatic: Small lymph nodes in the porta hepatis are not pathologically enlarged. There is no retroperitoneal lymphadenopathy. There are few scattered prominent mesenteric lymph nodes. As above, there is partial portal vein thrombosis within the left lobe of the liver. The main portal vein, superior mesenteric and splenic veins are patent. Mild aortic and branch vessel atherosclerosis.  Reproductive: Hysterectomy.  No adnexal mass. Other: Bilateral inguinal hernias containing fat. No herniated bowel. Musculoskeletal: No acute or significant osseous findings. There is an 8 mm nonspecific sclerotic lesion in the right aspect of the L3 vertebral body (image number 40). No lytic lesion. IMPRESSION: 1. 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 The main portal vein is patent. No other evidence of vascular occlusion. 2. Prominent fat deposition in the wall of the right colon as can be seen with chronic inflammation. No acute inflammation identified. The appendix appears normal. 3. Distal colonic diverticulosis without surrounding inflammation. 4. I attempted to personally call these results twice on 03/01/2016 at 6:56 pm to Dr. Maurilio Lovely , but was not able to reach her. These results will be called to the ordering clinician or representative by the Radiologist Assistant, and communication documented in the PACS or zVision Dashboard. Electronically Signed   By: Carey Bullocks M.D.   On: 03/01/2016 18:58   Dg Chest Port 1 View  03/01/2016  CLINICAL DATA:   Fever and chills, fever of 103.4 degrees EXAM: PORTABLE CHEST 1 VIEW COMPARISON:  None. FINDINGS: The heart size and vascular pattern are normal. There is no consolidation or effusion. There is uncoiling of the aorta. IMPRESSION: No active disease. Electronically Signed   By: Esperanza Heir M.D.   On: 03/01/2016 16:43   Assessment:  Becky Sutton is a 75 y.o. with a known history of hypertension,diabetes mellitus, hyperlipidemia presented to the ED with one day history of severe chills described as rigor, fever, and pain in the bilateral lower abdomen. She had leukocytosis, E coli in blood culture and partial portal vein thrombosis within the left hepatic lobe. This suggests septic or acute pylephlebitis. No evidence of intestinal infarction.The main portal vein is patent. No other evidence of vascular occlusion   2. Severe lower abd pain, fever, rigor have all improved on heparin and antibiotics. No evidence of HCC, pancreatic tumor or pancreatitis, cirrhosis, or ischemic bowel on CT. The cause of her PVT is unknown  and etiologies to be considered are invasion of portal vein by malignancy, doubt cirrhosis, R/o hypercoagulable state, infection.  Plan:  1. [place] of liver with doppler demonstrated no [place] evidence of portal vein occlusion or thrombosis. 2. Anticoagulation with Heparin in process to prevent extension of the clot and allow recanalization to avoid intestinal ischemia and portal hypertension. She does not have a hx of cirrhosis and reports a previous negative EGD. Long term plan for anticoagulation will be determined, with coagulopathy evaluation pending. 3.  Prominent fat deposition in the wall of the right colon as can be seen with chronic inflammation. No acute inflammation identified. The appendix appears normal. Distal colonic diverticulosis without surrounding inflammation. She reports a negative colonoscopy 2 years ago.   Brown Dunlap L. Zachery Dauer, MD, MPH  03/03/2016, 1:49 PM

## 2016-03-03 NOTE — Plan of Care (Signed)
Problem: Fluid Volume: Goal: Hemodynamic stability will improve Outcome: Progressing Titration of Heparin gtt based on pharmacy orders.  Problem: Physical Regulation: Goal: Diagnostic test results will improve Outcome: Progressing Remains on Meropenem IV Antibiotics.

## 2016-03-04 LAB — GLUCOSE, CAPILLARY
GLUCOSE-CAPILLARY: 83 mg/dL (ref 65–99)
GLUCOSE-CAPILLARY: 131 mg/dL — AB (ref 65–99)
GLUCOSE-CAPILLARY: 106 mg/dL — AB (ref 65–99)
Glucose-Capillary: 135 mg/dL — ABNORMAL HIGH (ref 65–99)
Glucose-Capillary: 59 mg/dL — ABNORMAL LOW (ref 65–99)

## 2016-03-04 LAB — CBC
HCT: 29.6 % — ABNORMAL LOW (ref 35.0–47.0)
Hemoglobin: 9.8 g/dL — ABNORMAL LOW (ref 12.0–16.0)
MCH: 28.1 pg (ref 26.0–34.0)
MCHC: 33.2 g/dL (ref 32.0–36.0)
MCV: 84.7 fL (ref 80.0–100.0)
PLATELETS: 281 10*3/uL (ref 150–440)
RBC: 3.49 MIL/uL — ABNORMAL LOW (ref 3.80–5.20)
RDW: 14 % (ref 11.5–14.5)
WBC: 12.5 10*3/uL — ABNORMAL HIGH (ref 3.6–11.0)

## 2016-03-04 LAB — CULTURE, BLOOD (ROUTINE X 2)

## 2016-03-04 LAB — HEPARIN LEVEL (UNFRACTIONATED): HEPARIN UNFRACTIONATED: 0.51 [IU]/mL (ref 0.30–0.70)

## 2016-03-04 MED ORDER — DEXTROSE 5 % IV SOLN
2.0000 g | INTRAVENOUS | Status: DC
  Administered 2016-03-04: 2 g via INTRAVENOUS
  Filled 2016-03-04 (×2): qty 2

## 2016-03-04 MED ORDER — APIXABAN 5 MG PO TABS: 5.0000 mg | ORAL_TABLET | Freq: Two times a day (BID) | ORAL | Status: DC

## 2016-03-04 MED ORDER — APIXABAN 5 MG PO TABS
10.0000 mg | ORAL_TABLET | Freq: Two times a day (BID) | ORAL | Status: DC
  Administered 2016-03-04 – 2016-03-05 (×3): 10 mg via ORAL
  Filled 2016-03-04 (×3): qty 2

## 2016-03-04 NOTE — Progress Notes (Signed)
ANTICOAGULATION CONSULT NOTE - Follow Up Consult  Pharmacy Consult for Heparin Drip Indication: portal vein thrombosis  No Known Allergies  Patient Measurements: Height: 5\' 8"  (172.7 cm) Weight: 208 lb 1.6 oz (94.394 kg) IBW/kg (Calculated) : 63.9 Heparin Dosing Weight: 84  Vital Signs: Temp: 100 F (37.8 C) (05/28 0433) Temp Source: Oral (05/28 0433) BP: 131/63 mmHg (05/28 0433) Pulse Rate: 96 (05/28 0433)  Labs:  Recent Labs  03/01/16 1630 03/01/16 1650 03/02/16 0410  03/02/16 2312 03/03/16 0701 03/04/16 0404  HGB 11.5*  --  10.6*  --   --  9.1* 9.8*  HCT 34.6*  --  32.1*  --   --  27.8* 29.6*  PLT 315  --  299  --   --  247 281  APTT  --  28  --   --   --   --   --   LABPROT  --  13.8  --   --   --   --   --   INR  --  1.04  --   --   --   --   --   HEPARINUNFRC  --   --  0.25*  < > 0.43 0.56 0.51  CREATININE 1.21*  --  1.21*  --   --   --   --   < > = values in this interval not displayed.  Estimated Creatinine Clearance: 49.7 mL/min (by C-G formula based on Cr of 1.21).   Assessment: Pharmacy consulted to dose heparin drip in a 75 yo female with portal vein thrombosis.    5/26 - HL at 2312: 0.43 5/27- HL at 0701: 0.56   Goal of Therapy:  Heparin level 0.3-0.7 units/ml Monitor platelets by anticoagulation protocol: Yes   Plan:  Heparin level therapeutic. Continue current rate. Pharmacy will continue to monitor daily.  Lavonia Eager A. Annettaookson, VermontPharm.D., BCPS Clinical Pharmacist 03/04/2016

## 2016-03-04 NOTE — Progress Notes (Signed)
[facility] [place] [facility]   PATIENT NAME: Becky FellsBarbara Sutton    MR#:  [id number]  DATE OF BIRTH:  06/20/1941  SUBJECTIVE:  CHIEF COMPLAINT:   Chief Complaint  Patient presents with  . Fever   Admitted with abd pain. No further abd pain. Low grade fever overnight.  REVIEW OF SYSTEMS:  CONSTITUTIONAL: Fever    CARDIOVASCULAR: No chest pain,   GASTROINTESTINAL: No abdominal pain.    DRUG ALLERGIES:  No Known Allergies  VITALS:  Blood pressure 131/63, pulse 96, temperature 100 F (37.8 C), temperature source Oral, resp. rate 18, height 5\' 8"  (1.727 m), weight 94.394 kg (208 lb 1.6 oz), SpO2 98 %.  PHYSICAL EXAMINATION:  GENERAL:  75 y.o.-year-old with no acute distress. Obese. EYES: Pupils equal, round, reactive to light. No scleral icterus. HEENT: Head atraumatic, normocephalic. Oropharynx and nasopharynx clear.  NECK:  Supple, no jugular venous distention.  LUNGS:CTA, . No use of accessory muscles of respiration.No dullness to percussion.  CARDIOVASCULAR: S1, S2 normal. No murmurs, rubs, or gallops.  ABDOMEN: Soft, nontender, nondistended. Bowel sounds present. No organomegaly or mass.  EXTREMITIES: Mild LE edema  NEUROLOGIC: Cranial nerves II through XII are intact. Muscle strength 5/5 in all extremities. Alert and oriented x 4   SKIN: No obvious rash, lesion, or ulcer.    LABORATORY PANEL:   CBC  Recent Labs Lab 03/04/16 0404  WBC 12.5*  HGB 9.8*  HCT 29.6*  PLT 281   ------------------------------------------------------------------------------------------------------------------  Chemistries   Recent Labs Lab 03/01/16 1630 03/02/16 0410  NA 138 139  K 3.6 3.5  CL 102 106  CO2 29 26  GLUCOSE 115* 139*  BUN 15 15  CREATININE 1.21* 1.21*  CALCIUM 9.8 8.6*  AST 48*  --   ALT 50  --   ALKPHOS 108  --   BILITOT 0.7  --     ------------------------------------------------------------------------------------------------------------------  Cardiac Enzymes No results for input(s): TROPONINI in the last 168 hours. ------------------------------------------------------------------------------------------------------------------  RADIOLOGY:  [place] Art/ven Flow Abd Pelv Doppler  03/03/2016  CLINICAL DATA:  Partial left portal vein thrombosis EXAM: DUPLEX ULTRASOUND OF LIVER TECHNIQUE: Color and duplex Doppler ultrasound was performed to evaluate the hepatic in-flow and out-flow vessels. COMPARISON:  CT 03/01/2016 FINDINGS: Portal Vein 1.1 cm diameter. No ultrasound evidence of portal vein occlusion or thrombosis. Velocities (all hepatopetal): Main:  37-57 cm/sec Right:  37 cm/sec Left:  27 cm/sec Hepatic Vein Velocities (all hepatofugal): Right:  26 cm/sec Middle:  50 cm/sec Left:  34 cm/sec Intrahepatic IVC patent, velocity 39 cm/second Hepatic Artery Velocity:  182 Cm/sec Spleen 6.6 x 7.6 x 2.5 cm. No evidence of Splenic Vein occlusion or thrombus. Velocity: 13 cm/sec Varices: None seen Ascites: None seen IMPRESSION: 1. No ultrasound evidence of persistent left portal vein thrombus. Normal hepatic Doppler assessment. Electronically Signed   By: Corlis Leak  Hassell M.D.   On: 03/03/2016 10:38    EKG:   Orders placed or performed during the hospital encounter of 03/01/16  . ED EKG 12-Lead  . ED EKG 12-Lead    ASSESSMENT AND PLAN:    #1 Sepsis due to bacteremia.: BC growing E.coli. Source unknown. Urine still shows clean. Sensativities are back. On meropenem. Had low grade fever this morning. Will continue IV abx 1 more day then switch to oral. Likely discharge tomorrow.    #2 Partial portal vein thrombosis: US from this morning shows thrombus no longer present. Adb pain resolved. Etiology unknown. Hypercoagulabilty workup in progress. Will  need to decide how long she will need anticoagulation if test do not reveal anything.  Will change IV heparin to PO anticoagulation.  #3 Bacteremia: Source unknown. Growing E. Coli. . On meropenem. Change to PO at discharge.   #3 DM- discontinue glipizide, continue lantus and sliding scale insulin  #4 HTN-Controlled on norvasc and lisinopril      CODE STATUS: full code.  TOTAL TIME TAKING CARE OF THIS PATIENT: 20 minutes.    POSSIBLE D/C IN 1 DAYS, DEPENDING ON CLINICAL CONDITION.   Alta Corning.D on 03/04/2016 at 10:59 AM  Between 7am to 6pm - Pager - [phone]  After 6pm go to [email or web address] - password [email or web address] [facility]  [facility]  Office  [phone]  CC: Primary care physician; No primary care provider on file.

## 2016-03-04 NOTE — Plan of Care (Signed)
Problem: Physical Regulation: Goal: Diagnostic test results will improve Outcome: Progressing Heparin gtt dc'd. Initiated dose of Eliquis. Meropenem dc'd. Rocephin IV dose given as ordered. Goal: Signs and symptoms of infection will decrease Rocephin IV given as ordered.

## 2016-03-04 NOTE — Progress Notes (Signed)
ANTICOAGULATION CONSULT NOTE - Initial Consult  Pharmacy Consult for apixaban  Indication: portal vein thrombosis  No Known Allergies  Patient Measurements: Height: 5\' 8"  (172.7 cm) Weight: 208 lb 1.6 oz (94.394 kg) IBW/kg (Calculated) : 63.9   Vital Signs: Temp: 100 F (37.8 C) (05/28 0433) Temp Source: Oral (05/28 0433) BP: 131/63 mmHg (05/28 0433) Pulse Rate: 96 (05/28 0433)  Labs:  Recent Labs  03/01/16 1630 03/01/16 1650 03/02/16 0410  03/02/16 2312 03/03/16 0701 03/04/16 0404  HGB 11.5*  --  10.6*  --   --  9.1* 9.8*  HCT 34.6*  --  32.1*  --   --  27.8* 29.6*  PLT 315  --  299  --   --  247 281  APTT  --  28  --   --   --   --   --   LABPROT  --  13.8  --   --   --   --   --   INR  --  1.04  --   --   --   --   --   HEPARINUNFRC  --   --  0.25*  < > 0.43 0.56 0.51  CREATININE 1.21*  --  1.21*  --   --   --   --   < > = values in this interval not displayed.  Estimated Creatinine Clearance: 49.7 mL/min (by C-G formula based on Cr of 1.21).   Medical History: Past Medical History  Diagnosis Date  . Diabetes mellitus without complication (HCC)   . Hypertension   . Hyperlipidemia     Medications:  Scheduled:  . amLODipine  10 mg Oral Daily  . apixaban  10 mg Oral BID   Followed by  . [START ON 03/11/2016] apixaban  5 mg Oral BID  . docusate sodium  100 mg Oral BID  . insulin aspart  0-5 Units Subcutaneous QHS  . insulin aspart  0-9 Units Subcutaneous TID WC  . insulin aspart protamine- aspart  20 Units Subcutaneous BID WC  . lisinopril  40 mg Oral Daily  . meropenem (MERREM) IV  1 g Intravenous Q8H  . simvastatin  20 mg Oral Daily   Infusions:    Assessment: Pharmacy consulted to dose apixaban in a 75 yo female with portal vein thrombosis.  Patient currently on heparin drip.    Scr: 1.21 (5/26), Wt: 94.4 kg  Goal of Therapy:  Monitor CBC and SCr per policy   Plan:  Will initiate patient on apixaban 10 mg po BID x 7 days followed by 5 mg po  BID for treatment of VTE. Spoke with RN and instructed her to turn off heparin drip and administer apixaban now.    Pharmacy will continue to follow.   Ajai Terhaar G 03/04/2016,11:16 AM

## 2016-03-04 NOTE — Progress Notes (Signed)
GI Follow Up Note  Referring Provider: Dr. Nemiah CommanderKalisetti Reason for Consultation: Portal Vein Thrombosis/No cirrhosis           Subjective   Becky GuarneriBarbara A Sutton is a 75 y.o. female with a known history of hypertension,diabetes mellitus, hyperlipidemia presented to the ED with one day history of severe chills described as rigor,  fever, and pain in the bilateral lower abdomen. She was found to have a portal vein thrombus. She continues to feel well this am.  Prior to Admission medications   Medication Sig Start Date End Date Taking? Authorizing Provider  amLODipine (NORVASC) 10 MG tablet Take 10 mg by mouth daily.   Yes Historical Provider, MD  aspirin EC 81 MG tablet Take 81 mg by mouth daily.   Yes Historical Provider, MD  furosemide (LASIX) 20 MG tablet Take 20 mg by mouth daily.   Yes Historical Provider, MD  glipiZIDE (GLUCOTROL XL) 10 MG 24 hr tablet Take 10 mg by mouth 2 (two) times daily.   Yes Historical Provider, MD  insulin aspart protamine - aspart (NOVOLOG MIX 70/30 FLEXPEN) (70-30) 100 UNIT/ML FlexPen Inject 20 Units into the skin 2 (two) times daily.   Yes Historical Provider, MD  quinapril (ACCUPRIL) 40 MG tablet Take 40 mg by mouth daily.   Yes Historical Provider, MD  simvastatin (ZOCOR) 20 MG tablet Take 20 mg by mouth daily.   Yes Historical Provider, MD    Current Facility-Administered Medications  Medication Dose Route Frequency Provider Last Rate Last Dose  . acetaminophen (TYLENOL) tablet 650 mg  650 mg Oral Q6H PRN [name], MD       Or  . acetaminophen (TYLENOL) suppository 650 mg  650 mg Rectal Q6H PRN [name], MD      . amLODipine (NORVASC) tablet 10 mg  10 mg Oral Daily [name], MD   10 mg at 03/04/16 1057  . apixaban (ELIQUIS) tablet 10 mg  10 mg Oral BID Crystal G Scarpena, RPH       Followed by  . [START ON 03/11/2016] apixaban (ELIQUIS) tablet 5 mg  5 mg Oral BID Crystal G Arnette SchaumannScarpena, RPH      . cefTRIAXone (ROCEPHIN) 2 g in dextrose 5 % 50  mL IVPB  2 g Intravenous Q24H Gracelyn NurseJohn D Johnston, MD      . docusate sodium (COLACE) capsule 100 mg  100 mg Oral BID [name], MD   100 mg at 03/02/16 1011  . insulin aspart (novoLOG) injection 0-5 Units  0-5 Units Subcutaneous QHS [name], MD   2 Units at 03/01/16 2333  . insulin aspart (novoLOG) injection 0-9 Units  0-9 Units Subcutaneous TID WC [name], MD   1 Units at 03/04/16 0816  . insulin aspart protamine- aspart (NOVOLOG MIX 70/30) injection 20 Units  20 Units Subcutaneous BID WC [name], MD   20 Units at 03/04/16 0817  . lisinopril (PRINIVIL,ZESTRIL) tablet 40 mg  40 mg Oral Daily [name], MD   40 mg at 03/04/16 1057  . morphine 2 MG/ML injection 2 mg  2 mg Intravenous Q4H PRN [name], MD      . ondansetron (ZOFRAN) tablet 4 mg  4 mg Oral Q6H PRN [name], MD       Or  . ondansetron (ZOFRAN) injection 4 mg  4 mg Intravenous Q6H PRN [name], MD      . oxyCODONE (Oxy IR/ROXICODONE) immediate release tablet 5 mg  5 mg Oral Q4H PRN Radhika  Kalisetti, MD      . polyethylene glycol (MIRALAX / GLYCOLAX) packet 17 g  17 g Oral Daily PRN Enid Baas, MD      . simvastatin (ZOCOR) tablet 20 mg  20 mg Oral Daily Enid Baas, MD   20 mg at 03/04/16 1057    Allergies as of 03/01/2016  . (No Known Allergies)      Physical Exam:  Vital signs in last 24 hours: Temp:  [97.9 F (36.6 C)-100 F (37.8 C)] 100 F (37.8 C) (05/28 0433) Pulse Rate:  [96-114] 96 (05/28 0433) Resp:  [18] 18 (05/28 0433) BP: (131-147)/(50-67) 131/63 mmHg (05/28 0433) SpO2:  [96 %-100 %] 98 % (05/28 0433) Last BM Date: 03/03/16  General:  Well-developed, well-nourished and in no acute distress Head:  Head without obvious abnormality, atraumatic  Eyes:   Conjunctiva pink, sclera anicteric   ENT:   Mouth free of lesions, mucosa moist, tongue pink, no thrush noted, teeth and gums normal Lungs: Clear to auscultation bilaterally,  respirations unlabored Heart:     Normal S1S2, no rubs, murmurs, gallops. Abdomen: Soft,nt, nd, no hepatosplenomegaly, hernia, or mass and BS normal Extremities:   No edema, cyanosis, or clubbing  Data Reviewed:  LAB RESULTS:  Recent Labs  03/03/16 0701 03/04/16 0404  WBC 12.6* 12.5*  HGB 9.1* 9.8*  HCT 27.8* 29.6*  PLT 247 281   BMET  Recent Labs  03/01/16 1630 03/02/16 0410  NA 138 139  K 3.6 3.5  CL 102 106  CO2 29 26  GLUCOSE 115* 139*  BUN 15 15  CREATININE 1.21* 1.21*  CALCIUM 9.8 8.6*   LFT  Recent Labs  03/01/16 1630  PROT 7.7  ALBUMIN 3.5  AST 48*  ALT 50  ALKPHOS 108  BILITOT 0.7   PT/INR  Recent Labs  03/01/16 1650  LABPROT 13.8  INR 1.04   LIPASE 16, Hgb A1C 6.9, APTT baseline 28, Lactic Acid 0.8, Bld Cx pos E coli  STUDIES: Ct Abdomen Pelvis W Contrast  03/01/2016  CLINICAL DATA:  Right lower quadrant abdominal pain with fever and chills. Previous hysterectomy. EXAM: CT ABDOMEN AND PELVIS WITH CONTRAST TECHNIQUE: Multidetector CT imaging of the abdomen and pelvis was performed using the standard protocol following bolus administration of intravenous contrast. CONTRAST:  ISOVUE-300 IOPAMIDOL (ISOVUE-300) INJECTION 61% COMPARISON:  Abdominal ultrasound 04/27/2010. FINDINGS: Lower chest: Trace pleural effusion on the right with minimal bibasilar atelectasis. Hepatobiliary: There is branching low-density within the left hepatic lobe consistent with partial portal vein thrombosis. The portal vein is patent in the right lobe. No focal hepatic parenchymal lesions are present. The gallbladder is incompletely distended. There is no surrounding inflammation or biliary dilatation. Pancreas: Unremarkable. No pancreatic ductal dilatation or surrounding inflammatory changes. Spleen: Normal in size without focal abnormality. Adrenals/Urinary Tract: Both adrenal glands appear normal. The kidneys appear normal without evidence of urinary tract calculus,  suspicious lesion or hydronephrosis. No bladder abnormalities are seen. Stomach/Bowel: No evidence of bowel wall thickening, distention or surrounding inflammatory change. There is prominent fat deposition within the wall of the right colon. The terminal ileum appears normal. The appendix is probably visualized on the coronal images and appears normal. There are diverticular changes throughout the descending and sigmoid colon. Vascular/Lymphatic: Small lymph nodes in the porta hepatis are not pathologically enlarged. There is no retroperitoneal lymphadenopathy. There are few scattered prominent mesenteric lymph nodes. As above, there is partial portal vein thrombosis within the left lobe of the liver. The main  portal vein, superior mesenteric and splenic veins are patent. Mild aortic and branch vessel atherosclerosis. Reproductive: Hysterectomy.  No adnexal mass. Other: Bilateral inguinal hernias containing fat. No herniated bowel. Musculoskeletal: No acute or significant osseous findings. There is an 8 mm nonspecific sclerotic lesion in the right aspect of the L3 vertebral body (image number 40). No lytic lesion. IMPRESSION: 1. 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 The main portal vein is patent. No other evidence of vascular occlusion. 2. Prominent fat deposition in the wall of the right colon as can be seen with chronic inflammation. No acute inflammation identified. The appendix appears normal. 3. Distal colonic diverticulosis without surrounding inflammation. 4. I attempted to personally call these results twice on 03/01/2016 at 6:56 pm to Dr. Maurilio Lovely , but was not able to reach her. These results will be called to the ordering clinician or representative by the Radiologist Assistant, and communication documented in the PACS or zVision Dashboard. Electronically Signed   By: Carey Bullocks M.D.   On: 03/01/2016 18:58   Dg Chest Port 1 View  03/01/2016  CLINICAL DATA:  Fever and  chills, fever of 103.4 degrees EXAM: PORTABLE CHEST 1 VIEW COMPARISON:  None. FINDINGS: The heart size and vascular pattern are normal. There is no consolidation or effusion. There is uncoiling of the aorta. IMPRESSION: No active disease. Electronically Signed   By: Esperanza Heir M.D.   On: 03/01/2016 16:43   Assessment:  Becky Sutton is a 75 y.o. with a known history of hypertension,diabetes mellitus, hyperlipidemia presented to the ED with one day history of severe chills described as rigor, fever, and pain in the bilateral lower abdomen. She had leukocytosis, E coli in blood culture and partial portal vein thrombosis within the left hepatic lobe. This suggests septic or acute pylephlebitis. No evidence of intestinal infarction.The main portal vein is patent. No other evidence of vascular occlusion.  She continues to improve this am.   2. Severe lower abd pain, fever, rigor have all improved on heparin and antibiotics. No evidence of HCC, pancreatic tumor or pancreatitis, cirrhosis, or ischemic bowel on CT. The cause of her PVT is unknown  and etiologies to be considered are invasion of portal vein by malignancy, doubt cirrhosis, R/o hypercoagulable state, infection.  Plan:  1. [place] of liver with doppler demonstrated no [place] evidence of portal vein occlusion or thrombosis. 2. Anticoagulation with Heparin in process to prevent extension of the clot and allow recanalization to avoid intestinal ischemia and portal hypertension. She does not have a hx of cirrhosis and reports a previous negative EGD. Long term plan for anticoagulation will be determined, with coagulopathy evaluation pending.  3.  Prominent fat deposition in the wall of the right colon as can be seen with chronic inflammation. No acute inflammation identified. The appendix appears normal. Distal colonic diverticulosis without surrounding inflammation. She reports a negative colonoscopy 2 years ago.   Adaliz Dobis L. Zachery Dauer, MD, MPH  03/04/2016,  11:57 AM

## 2016-03-05 LAB — CBC
HEMATOCRIT: 29.6 % — AB (ref 35.0–47.0)
HEMOGLOBIN: 9.8 g/dL — AB (ref 12.0–16.0)
MCH: 28.1 pg (ref 26.0–34.0)
MCHC: 32.9 g/dL (ref 32.0–36.0)
MCV: 85.4 fL (ref 80.0–100.0)
Platelets: 298 10*3/uL (ref 150–440)
RBC: 3.47 MIL/uL — ABNORMAL LOW (ref 3.80–5.20)
RDW: 13.8 % (ref 11.5–14.5)
WBC: 11.8 10*3/uL — AB (ref 3.6–11.0)

## 2016-03-05 LAB — BASIC METABOLIC PANEL
ANION GAP: 5 (ref 5–15)
BUN: 13 mg/dL (ref 6–20)
CHLORIDE: 106 mmol/L (ref 101–111)
CO2: 28 mmol/L (ref 22–32)
Calcium: 9 mg/dL (ref 8.9–10.3)
Creatinine, Ser: 1.05 mg/dL — ABNORMAL HIGH (ref 0.44–1.00)
GFR calc non Af Amer: 51 mL/min — ABNORMAL LOW (ref >60)
GFR, EST AFRICAN AMERICAN: 60 mL/min — AB (ref >60)
Glucose, Bld: 87 mg/dL (ref 65–99)
Potassium: 3.8 mmol/L (ref 3.5–5.1)
Sodium: 139 mmol/L (ref 135–145)

## 2016-03-05 LAB — GLUCOSE, CAPILLARY
GLUCOSE-CAPILLARY: 67 mg/dL (ref 65–99)
Glucose-Capillary: 118 mg/dL — ABNORMAL HIGH (ref 65–99)

## 2016-03-05 MED ORDER — APIXABAN 5 MG PO TABS: 5.0000 mg | ORAL_TABLET | Freq: Two times a day (BID) | ORAL | Status: DC

## 2016-03-05 MED ORDER — CIPROFLOXACIN HCL 500 MG PO TABS: 500.0000 mg | ORAL_TABLET | Freq: Two times a day (BID) | ORAL | Status: DC

## 2016-03-05 NOTE — Progress Notes (Signed)
Pt is alert and oriented x 4, denies pain, ambulatory in room, daughter at bedside, good appetite, pt is d/c to home, pt instructed to f/u with pcp in 1 week and GI within 3 weeks. Rx for cipro and eliquis provided to patient, pt educated on adverse effects of eliquis and appropriate follow up. Pt reports understanding of d/c instructions and has no further questions at this time. Uneventful shift.

## 2016-03-05 NOTE — Discharge Instructions (Signed)
Bacteremia °Bacteremia is the presence of bacteria in the blood. A small amount of bacteria may not cause any symptoms. °Sometimes, the bacteria spread and cause infection in other parts of the body, such as the heart, joints, bones, or brain. Having a great amount of bacteria can cause a serious, sometimes life-threatening infection called sepsis. °CAUSES °This condition is caused by bacteria that get into the blood. Bacteria can enter the blood: °· During a dental or medical procedure. °· After you brush your teeth so hard that the gums bleed. °· Through a scrape or cut on your skin. °More severe types of bacteremia can be caused by: °· A bacterial infection, such as pneumonia, that spreads to the blood. °· Using a dirty needle. °RISK FACTORS °This condition is more likely to develop in: °· Children and elderly adults. °· People who have a long-lasting (chronic) disease or medical condition. °· People who have an artificial joint or heart valve. °· People who have heart valve disease. °· People who have a tube, such as a catheter or IV tube, that has been inserted for a medical treatment. °· People who have a weak body defense system (immune system). °· People who use IV drugs. °SYMPTOMS °Usually, this condition does not cause symptoms when it is mild. When it is more serious, it may cause: °· Fever. °· Chills. °· Racing heart. °· Shortness of breath. °· Dizziness. °· Weakness. °· Confusion. °· Nausea or vomiting. °· Diarrhea. °Bacteremia that has spread to other parts of the body may cause symptoms in those areas. °DIAGNOSIS °This condition may be diagnosed with a physical exam and tests, such as: °· A complete blood count (CBC). This test looks for signs of infection. °· Blood cultures. These look for bacteria in your blood. °· Tests of any IV tubes. These look for a source of infection. °· Urine tests. °· Imaging tests, such as an X-ray, CT scan, MRI, or heart ultrasound. °TREATMENT °If the condition is mild,  treatment is usually not needed. Usually, the body's immune system will remove the bacteria. If the condition is more serious, it may be treated with: °· Antibiotic medicines through an IV tube. These may be given for about 2 weeks. At first, the antibiotic that is given may kill most types of blood bacteria. If your test results show that a certain kind of bacteria is causing problems, the antibiotic may be changed to kill only the bacteria that are causing problems. °· Antibiotics taken by mouth. °· Removing any catheter or IV tube that is a source of infection. °· Blood pressure and breathing support, if needed. °· Surgery to control the source or spread of infection, if needed. °HOME CARE INSTRUCTIONS °· Take over-the-counter and prescription medicines only as told by your health care provider. °· If you were prescribed an antibiotic, take it as told by your health care provider. Do not stop taking the antibiotic even if you start to feel better. °· Rest at home until your condition is under control. °· Drink enough fluid to keep your urine clear or pale yellow. °· Keep all follow-up visits as told by your health care provider. This is important. °PREVENTION °Take these actions to help prevent future episodes of bacteremia: °· Get all vaccinations as recommended by your health care provider. °· Clean and cover scrapes or cuts. °· Bathe regularly. °· Wash your hands often. °· Before any dental or surgical procedure, ask your health care provider if you should take an antibiotic. °SEEK MEDICAL   CARE IF: °· Your symptoms get worse. °· You continue to have symptoms after treatment. °· You develop new symptoms after treatment. °SEEK IMMEDIATE MEDICAL CARE IF: °· You have chest pain or trouble breathing. °· You develop confusion, dizziness, or weakness. °· You develop pale skin. °  °This information is not intended to replace advice given to you by your health care provider. Make sure you discuss any questions you have  with your health care provider. °  °Document Released: 07/08/2006 Document Revised: 06/15/2015 Document Reviewed: 11/27/2014 °Elsevier Interactive Patient Education ©2016 [employer] ° °

## 2016-03-05 NOTE — Consult Note (Signed)
  Patient denies any abd pain, chills, fever, nausea, vomiting.  [facility] and will need to be checked when she goes on anticoagulant for the resolving PVT.  May follow with me in  A few weeks.  I agree with plans for discharge.

## 2016-03-05 NOTE — Care Management Important Message (Signed)
Important Message  Patient Details  Name: Becky GuarneriBarbara A Markell MRN: [id number] Date of Birth: 04/03/1941   Medicare Important Message Given:  Yes    [name] 03/05/2016, 11:08 AM

## 2016-03-05 NOTE — Discharge Summary (Signed)
Physician Discharge Summary  Patient ID: Becky GuarneriBarbara A Sutton MRN: [id number] DOB/AGE: 74/02/1941 75 y.o.  Admit date: 03/01/2016 Discharge date: 03/05/2016  Admission Diagnoses: Portal Vein Thrombosis, Sepsis  Discharge Diagnoses:  Active Problems:   Sepsis (HCC) Portal vein Throbosis  Discharged Condition: stable  Hospital Course: 1. 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 Patient started on IV heparin. Follow up US showed that area was patent. Will need 3 months of anticoagulation with Eliquis. Etiology including hypercoagulability studies have been negative so far. She will follow up with Dr Markham JordanElliot in 3 weeks and her PCP in 1 week.  2. Sepsis: BC grew E. Coli. She will be discharge on cipro which it was sensative to. Source of bacteremia was not determined.  3. HTN: Controlled with current meds.  Consults: GI  Significant Diagnostic Studies: Abd CT and US  Treatments: antibiotics: Cipro and Meropenem  Discharge Exam: Blood pressure 127/65, pulse 92, temperature 99.1 F (37.3 C), temperature source Oral, resp. rate 18, height 5\' 8"  (1.727 m), weight 94.394 kg (208 lb 1.6 oz), SpO2 97 %. Cardio: regular rate and rhythm GI: soft, non-tender; bowel sounds normal; no masses,  no organomegaly  Disposition: Final discharge disposition not confirmed  Discharge Instructions    Activity as tolerated - No restrictions    Complete by:  As directed      Diet Carb Modified    Complete by:  As directed             Medication List    TAKE these medications        amLODipine 10 MG tablet  Commonly known as:  NORVASC  Take 10 mg by mouth daily.     apixaban 5 MG Tabs tablet  Commonly known as:  ELIQUIS  Take 1 tablet (5 mg total) by mouth 2 (two) times daily. Take 2 tablets twice a day for 6 days then take 1 tablet twice a day there after.  Start taking on:  03/11/2016     aspirin EC 81 MG tablet  Take 81 mg by mouth daily.     ciprofloxacin 500 MG  tablet  Commonly known as:  CIPRO  Take 1 tablet (500 mg total) by mouth 2 (two) times daily.     furosemide 20 MG tablet  Commonly known as:  LASIX  Take 20 mg by mouth daily.     glipiZIDE 10 MG 24 hr tablet  Commonly known as:  GLUCOTROL XL  Take 10 mg by mouth 2 (two) times daily.     NOVOLOG MIX 70/30 FLEXPEN (70-30) 100 UNIT/ML FlexPen  Generic drug:  insulin aspart protamine - aspart  Inject 20 Units into the skin 2 (two) times daily.     quinapril 40 MG tablet  Commonly known as:  ACCUPRIL  Take 40 mg by mouth daily.     simvastatin 20 MG tablet  Commonly known as:  ZOCOR  Take 20 mg by mouth daily.           Follow-up Information    Follow up with Lynnae PrudeELLIOTT, ROBERT, MD In 3 weeks.   Specialty:  Gastroenterology   Contact information:   [phone] [facility] [place] [id number] [phone]       Follow up with PCP. Schedule an appointment as soon as possible for a visit in 1 week.     Time spent= 35 min Signed: Gracelyn NurseJohnston,  Evia Goldsmith D 03/05/2016, 9:58 AM

## 2016-03-06 LAB — CULTURE, BLOOD (ROUTINE X 2)

## 2016-03-10 LAB — PROTHROMBIN GENE MUTATION

## 2016-03-10 LAB — PTT-LA MIX: PTT-LA Mix: 55.6 s — ABNORMAL HIGH (ref 0.0–40.6)

## 2016-03-10 LAB — LUPUS ANTICOAGULANT PANEL
DRVVT: 38.8 s (ref 0.0–47.0)
PTT Lupus Anticoagulant: 61.3 s — ABNORMAL HIGH (ref 0.0–43.6)

## 2016-03-10 LAB — HEXAGONAL PHASE PHOSPHOLIPID: Hexagonal Phase Phospholipid: 7 s (ref 0–11)

## 2016-03-10 LAB — BETA-2-GLYCOPROTEIN I ABS, IGG/M/A: Beta-2 Glyco I IgG: <9 GPI IgG units (ref 0–20)

## 2016-03-10 LAB — FACTOR 5 LEIDEN

## 2016-03-10 LAB — PROTEIN S ACTIVITY: PROTEIN S ACTIVITY: 61 % — AB (ref 63–140)

## 2016-03-10 LAB — PROTEIN S, TOTAL: PROTEIN S AG TOTAL: 133 % (ref 60–150)

## 2016-03-10 LAB — PROTEIN C ACTIVITY: Protein C Activity: 101 % (ref 73–180)

## 2016-03-10 LAB — CARDIOLIPIN ANTIBODIES, IGG, IGM, IGA: Anticardiolipin IgM: <9 MPL U/mL (ref 0–12)

## 2016-03-10 LAB — HOMOCYSTEINE: HOMOCYSTEINE-NORM: 9.7 umol/L (ref 0.0–15.0)

## 2016-03-10 LAB — PROTEIN C, TOTAL: PROTEIN C, TOTAL: 72 % (ref 60–150)

## 2017-07-21 IMAGING — CT CT ABD-PELV W/ CM
2 of 5 series · 15 of 46 positions shown, 17 images · IV contrast (iopamidol)
Comparison: Abdominal ultrasound 04/27/2010.

CLINICAL DATA: Right lower quadrant abdominal pain with fever and
chills. Previous hysterectomy.

EXAM:
CT ABDOMEN AND PELVIS WITH CONTRAST
TECHNIQUE: Multidetector CT imaging of the abdomen and pelvis was performed
using the standard protocol following bolus administration of
intravenous contrast.
CONTRAST:  100mL KNDA6J-HLL IOPAMIDOL (KNDA6J-HLL) INJECTION 61%

[Series 2: routine abd pel with · axial · 0.81mm/px · z∈[-478,-48]mm · 12 of 98 slices shown, 14 images]
[im 6/98  soft-tissue]
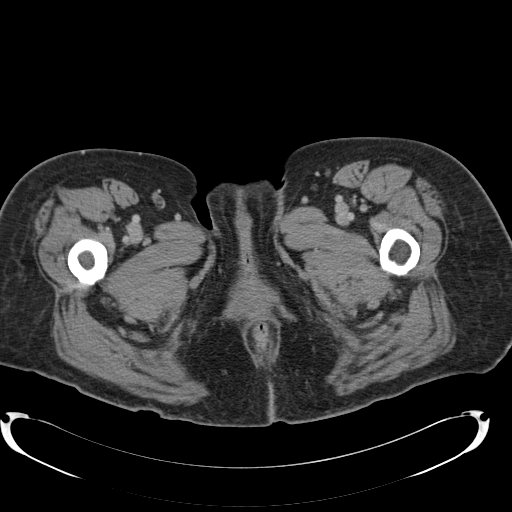
[im 6/98  bone]
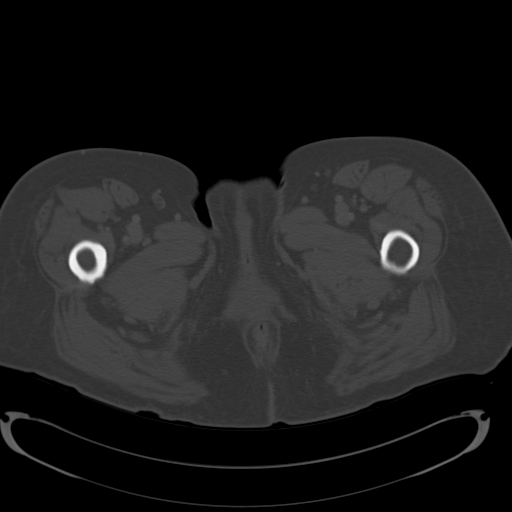
[im 16/98  soft-tissue]
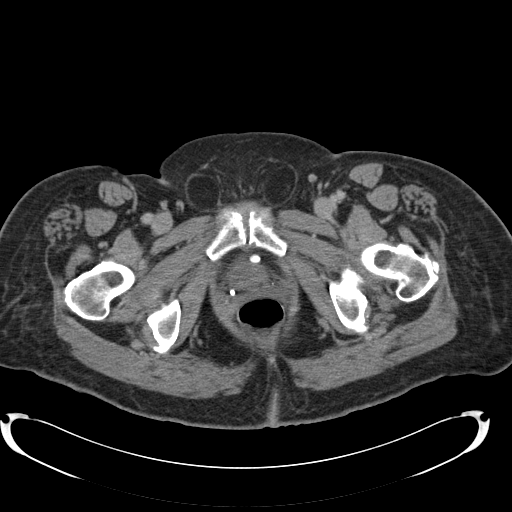
[im 21/98  soft-tissue]
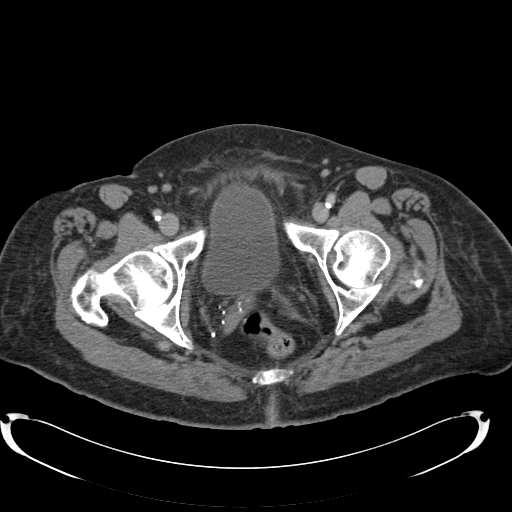
[im 31/98  soft-tissue]
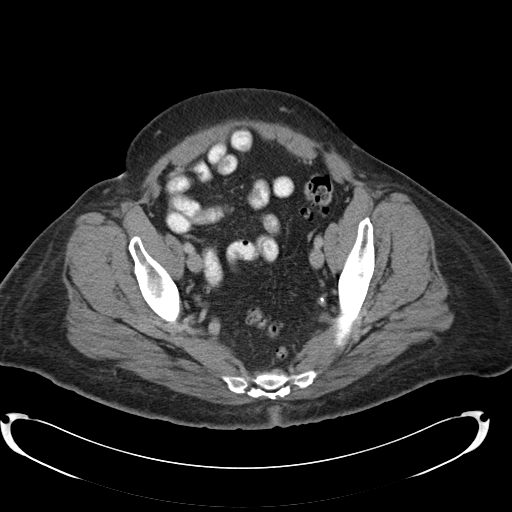
[im 36/98  soft-tissue]
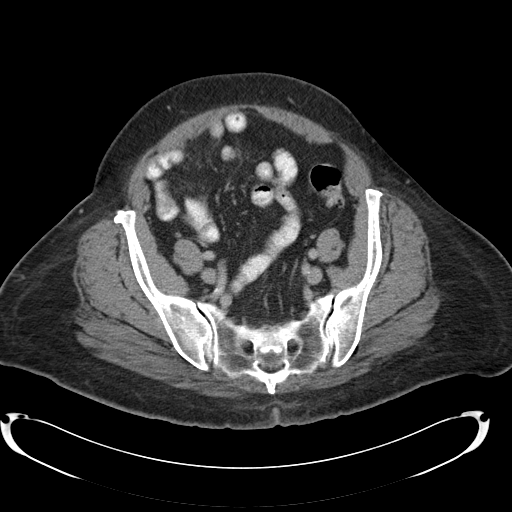
[im 46/98  soft-tissue]
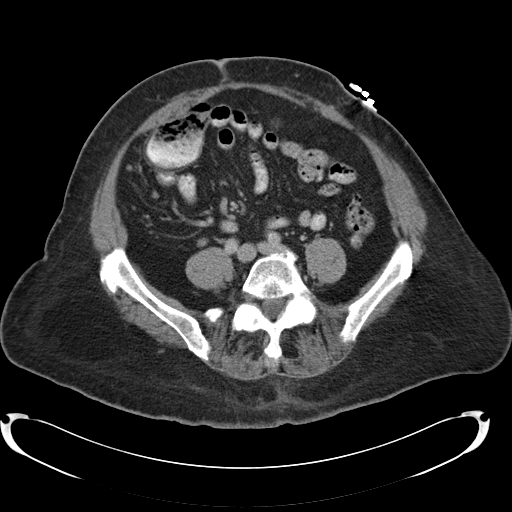
[im 52/98  soft-tissue]
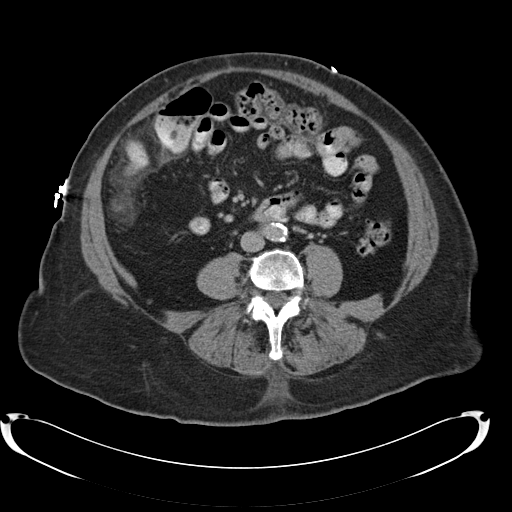
[im 62/98  soft-tissue]
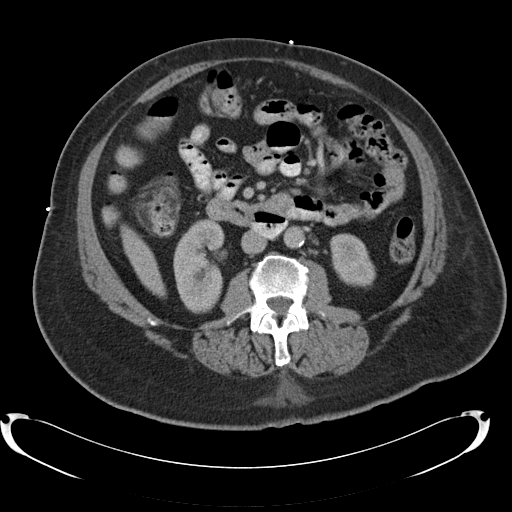
[im 67/98  soft-tissue]
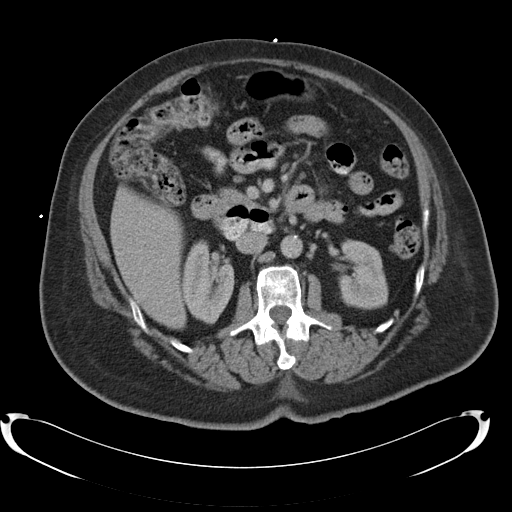
[im 67/98  bone]
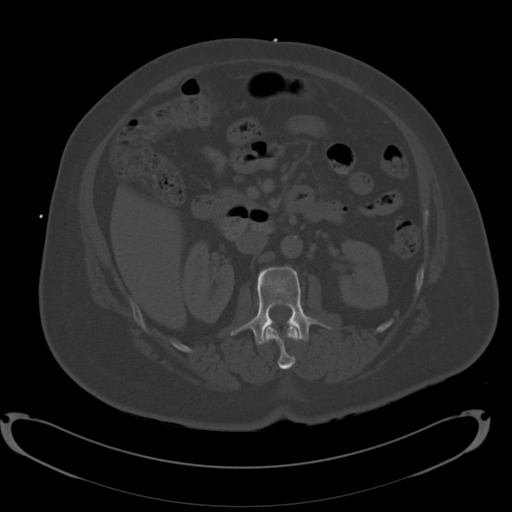
[im 77/98  soft-tissue]
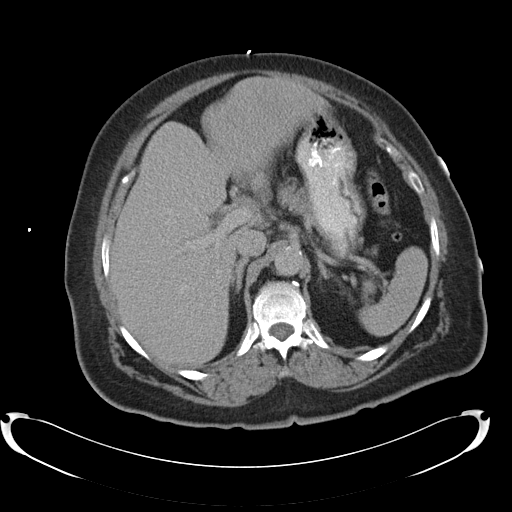
[im 82/98  soft-tissue]
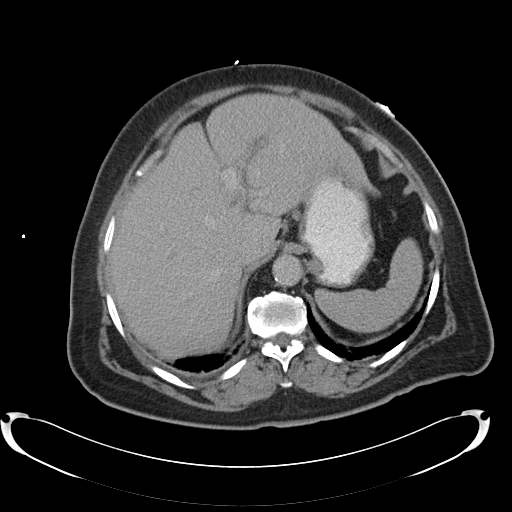
[im 92/98  soft-tissue]
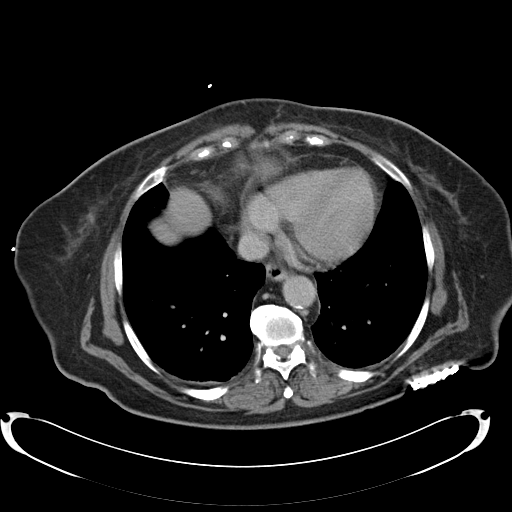

[Series 5: cor routine abd pel with · coronal · 0.77mm/px · 3 of 148 slices shown]
[im 50/148  soft-tissue]
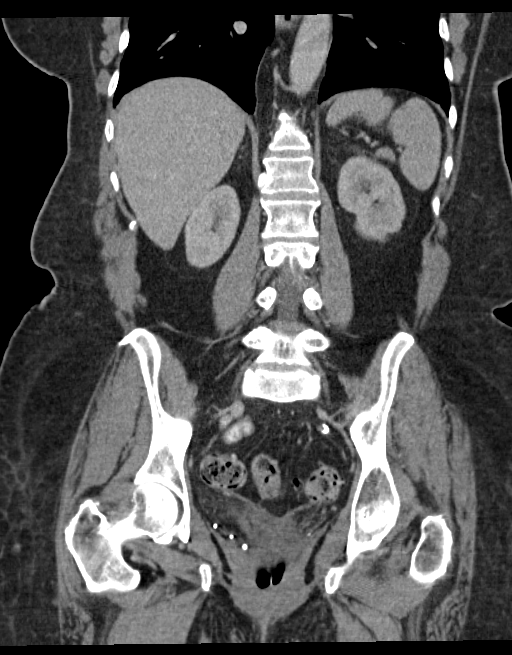
[im 66/148  soft-tissue]
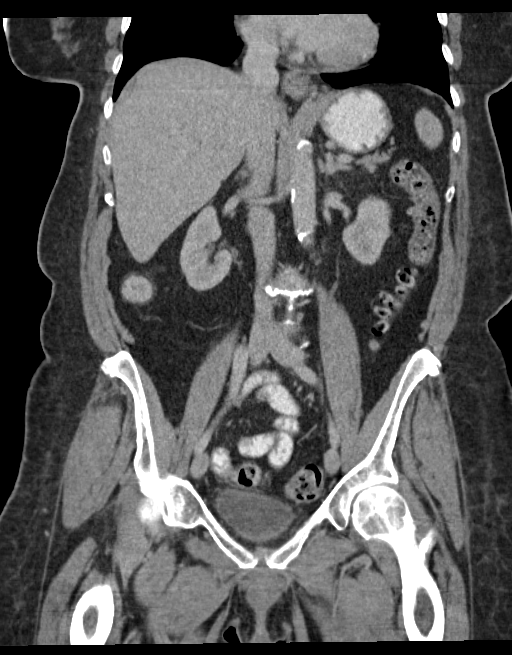
[im 82/148  soft-tissue]
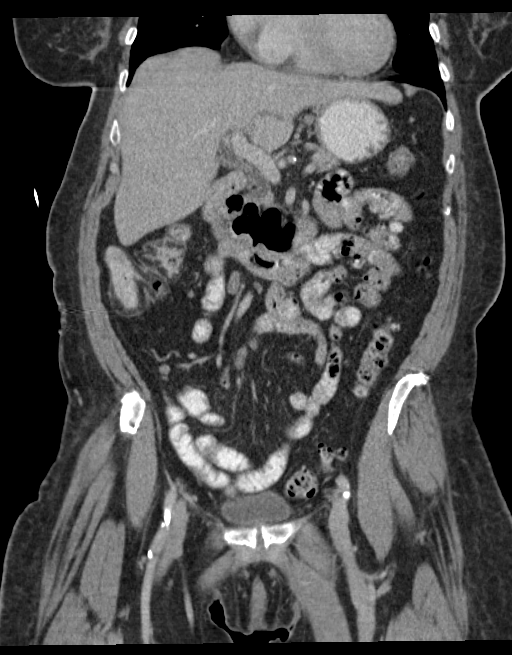

[15 of 46 positions shown; findings below may reference images not displayed]

FINDINGS: Lower chest: Trace pleural effusion on the right with minimal
bibasilar atelectasis.

Hepatobiliary: There is branching low-density within the left
hepatic lobe consistent with partial portal vein thrombosis. The
portal vein is patent in the right lobe. No focal hepatic
parenchymal lesions are present. The gallbladder is incompletely
distended. There is no surrounding inflammation or biliary
dilatation.

Pancreas: Unremarkable. No pancreatic ductal dilatation or
surrounding inflammatory changes.

Spleen: Normal in size without focal abnormality.

Adrenals/Urinary Tract: Both adrenal glands appear normal. The
kidneys appear normal without evidence of urinary tract calculus,
suspicious lesion or hydronephrosis. No bladder abnormalities are
seen.

Stomach/Bowel: No evidence of bowel wall thickening, distention or
surrounding inflammatory change. There is prominent fat deposition
within the wall of the right colon. The terminal ileum appears
normal. The appendix is probably visualized on the coronal images
and appears normal. There are diverticular changes throughout the
descending and sigmoid colon.

Vascular/Lymphatic: Small lymph nodes in the porta hepatis are not
pathologically enlarged. There is no retroperitoneal
lymphadenopathy. There are few scattered prominent mesenteric lymph
nodes. As above, there is partial portal vein thrombosis within the
left lobe of the liver. The main portal vein, superior mesenteric
and splenic veins are patent. Mild aortic and branch vessel
atherosclerosis.

Reproductive: Hysterectomy.  No adnexal mass.

Other: Bilateral inguinal hernias containing fat. No herniated
bowel.

Musculoskeletal: No acute or significant osseous findings. There is
an 8 mm nonspecific sclerotic lesion in the right aspect of the L3
vertebral body (image number 40). No lytic lesion.
IMPRESSION: 1. Partial portal vein thrombosis within the left hepatic lobe. The
main portal vein is patent. No other evidence of vascular occlusion.
2. Prominent fat deposition in the wall of the right colon as can be
seen with chronic inflammation. No acute inflammation identified.
The appendix appears normal.
3. Distal colonic diverticulosis without surrounding inflammation.
4. I attempted to personally call these results twice on 03/01/2016
at [DATE] to Dr. RUTILO NICKELS , but was not able to reach her.
These results will be called to the ordering clinician or
representative by the Radiologist Assistant, and communication
documented in the PACS or zVision Dashboard.

## 2017-07-21 IMAGING — DX DG CHEST 1V PORT
1 series · 1 of 1 positions shown · non-contrast
Comparison: None.

CLINICAL DATA: Fever and chills, fever of 103.4 degrees

EXAM:
PORTABLE CHEST 1 VIEW

[chest ap]
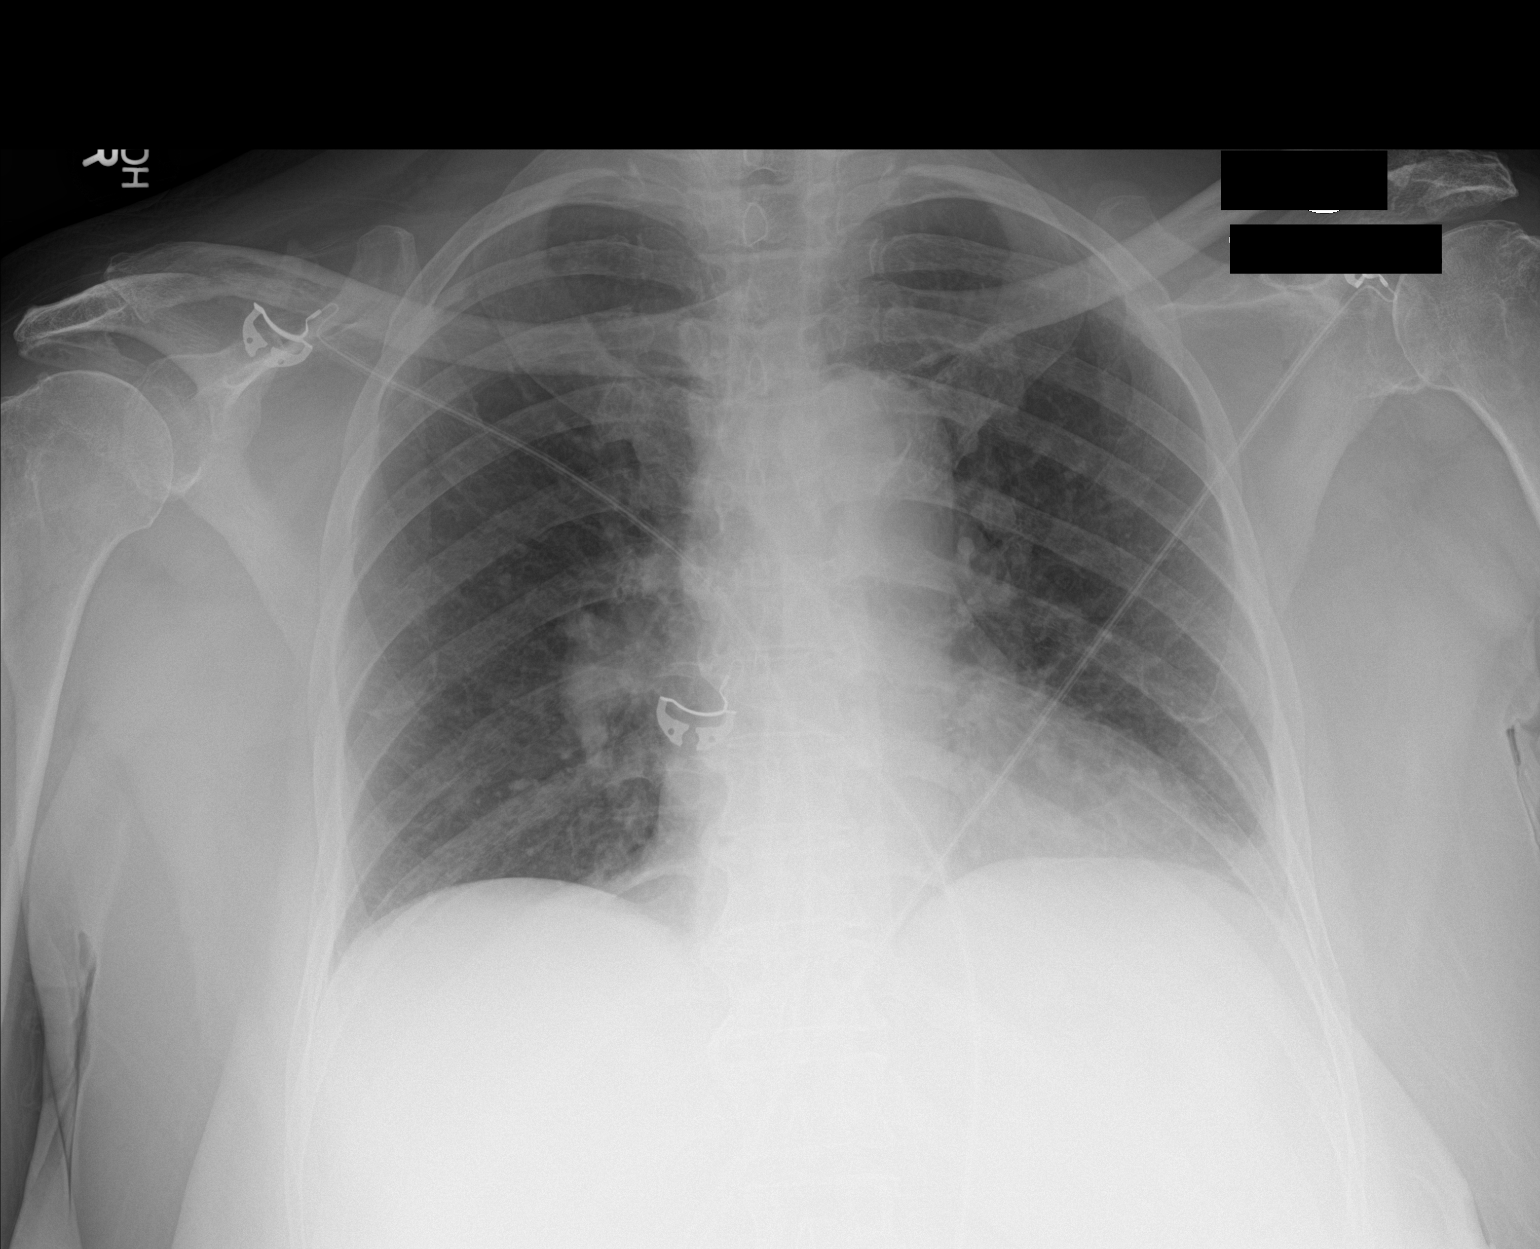

[1 of 1 positions shown; findings below may reference images not displayed]

FINDINGS: The heart size and vascular pattern are normal. There is no
consolidation or effusion. There is uncoiling of the aorta.
IMPRESSION: No active disease.

## 2018-08-24 ENCOUNTER — Emergency Department: Payer: Medicare Other

## 2018-08-24 ENCOUNTER — Other Ambulatory Visit: Payer: Self-pay

## 2018-08-24 ENCOUNTER — Emergency Department
Admission: EM | Admit: 2018-08-24 | Discharge: 2018-08-25 | Disposition: A | Discharge status: Home / Self Care | Payer: Medicare Other | Attending: Emergency Medicine | Admitting: Emergency Medicine

## 2018-08-24 DIAGNOSIS — E876 Hypokalemia: Secondary | ICD-10-CM | POA: Diagnosis not present

## 2018-08-24 DIAGNOSIS — Z7982 Long term (current) use of aspirin: Secondary | ICD-10-CM | POA: Diagnosis not present

## 2018-08-24 DIAGNOSIS — E785 Hyperlipidemia, unspecified: Secondary | ICD-10-CM | POA: Diagnosis not present

## 2018-08-24 DIAGNOSIS — E119 Type 2 diabetes mellitus without complications: Secondary | ICD-10-CM | POA: Insufficient documentation

## 2018-08-24 DIAGNOSIS — Z79899 Other long term (current) drug therapy: Secondary | ICD-10-CM | POA: Insufficient documentation

## 2018-08-24 DIAGNOSIS — R600 Localized edema: Secondary | ICD-10-CM | POA: Diagnosis present

## 2018-08-24 DIAGNOSIS — Z7901 Long term (current) use of anticoagulants: Secondary | ICD-10-CM | POA: Diagnosis not present

## 2018-08-24 DIAGNOSIS — I10 Essential (primary) hypertension: Secondary | ICD-10-CM | POA: Diagnosis not present

## 2018-08-24 DIAGNOSIS — Z794 Long term (current) use of insulin: Secondary | ICD-10-CM | POA: Diagnosis not present

## 2018-08-24 DIAGNOSIS — R609 Edema, unspecified: Secondary | ICD-10-CM

## 2018-08-24 LAB — HEPATIC FUNCTION PANEL
ALT: 15 U/L (ref 0–44)
AST: 23 U/L (ref 15–41)
Albumin: 3.4 g/dL — ABNORMAL LOW (ref 3.5–5.0)
Alkaline Phosphatase: 112 U/L (ref 38–126)
Bilirubin, Direct: 0.1 mg/dL (ref 0.0–0.2)
Indirect Bilirubin: 0.5 mg/dL (ref 0.3–0.9)
TOTAL PROTEIN: 7.9 g/dL (ref 6.5–8.1)
Total Bilirubin: 0.6 mg/dL (ref 0.3–1.2)

## 2018-08-24 LAB — BASIC METABOLIC PANEL
ANION GAP: 8 (ref 5–15)
BUN: 16 mg/dL (ref 8–23)
CO2: 31 mmol/L (ref 22–32)
Calcium: 9 mg/dL (ref 8.9–10.3)
Chloride: 103 mmol/L (ref 98–111)
Creatinine, Ser: 1.62 mg/dL — ABNORMAL HIGH (ref 0.44–1.00)
GFR, EST AFRICAN AMERICAN: 35 mL/min — AB (ref >60)
GFR, EST NON AFRICAN AMERICAN: 30 mL/min — AB (ref >60)
Glucose, Bld: 113 mg/dL — ABNORMAL HIGH (ref 70–99)
POTASSIUM: 3 mmol/L — AB (ref 3.5–5.1)
SODIUM: 142 mmol/L (ref 135–145)

## 2018-08-24 LAB — CBC
HCT: 35.5 % — ABNORMAL LOW (ref 36.0–46.0)
HEMOGLOBIN: 11.2 g/dL — AB (ref 12.0–15.0)
MCH: 27.6 pg (ref 26.0–34.0)
MCHC: 31.5 g/dL (ref 30.0–36.0)
MCV: 87.4 fL (ref 80.0–100.0)
NRBC: 0 % (ref 0.0–0.2)
PLATELETS: 328 10*3/uL (ref 150–400)
RBC: 4.06 MIL/uL (ref 3.87–5.11)
RDW: 13.9 % (ref 11.5–15.5)
WBC: 10.9 10*3/uL — AB (ref 4.0–10.5)

## 2018-08-24 LAB — BRAIN NATRIURETIC PEPTIDE: B NATRIURETIC PEPTIDE 5: 106 pg/mL — AB (ref 0.0–100.0)

## 2018-08-24 MED ORDER — POTASSIUM CHLORIDE ER 10 MEQ PO TBCR: 20.0000 meq | EXTENDED_RELEASE_TABLET | Freq: Every day | ORAL | 0 refills | Status: AC

## 2018-08-24 MED ORDER — POTASSIUM CHLORIDE CRYS ER 20 MEQ PO TBCR
80.0000 meq | EXTENDED_RELEASE_TABLET | Freq: Once | ORAL | Status: AC
  Administered 2018-08-24: 80 meq via ORAL
  Filled 2018-08-24: qty 4

## 2018-08-24 MED ORDER — MAGNESIUM GLUCONATE 500 MG PO TABS
500.0000 mg | ORAL_TABLET | Freq: Once | ORAL | Status: AC
  Administered 2018-08-25: 500 mg via ORAL
  Filled 2018-08-24: qty 1

## 2018-08-24 MED ORDER — MAGNESIUM GLUCONATE 30 MG PO TABS: 30.0000 mg | ORAL_TABLET | Freq: Two times a day (BID) | ORAL | 0 refills | Status: AC

## 2018-08-24 MED ORDER — FUROSEMIDE 20 MG PO TABS: 20.0000 mg | ORAL_TABLET | Freq: Two times a day (BID) | ORAL | 0 refills | Status: DC

## 2018-08-24 MED ORDER — FUROSEMIDE 40 MG PO TABS
40.0000 mg | ORAL_TABLET | Freq: Once | ORAL | Status: AC
  Administered 2018-08-24: 40 mg via ORAL
  Filled 2018-08-24: qty 1

## 2018-08-24 NOTE — ED Provider Notes (Signed)
[facility] Emergency Department Provider Note  ____________________________________________   First MD Initiated Contact with Patient 08/24/18 2248     (approximate)  I have reviewed the triage vital signs and the nursing notes.   HISTORY  Chief Complaint Leg Swelling   HPI Becky Sutton is a 77 y.o. female who comes to the emergency department with 3 days of bilateral lower extremity edema.  She sleeps on one pillow and does not wake at night short of breath.  Her symptoms are worse throughout the day and improved at night after elevating her legs.  She denies chest pain or shortness of breath.  Her symptoms are nonexertional.  She has a long-standing history of diabetes mellitus and hypertension for which she takes glipizide as well as Norvasc and Accupril.  No change in her medications recently.  She also takes Lasix 20 mg a day for "swelling".  She denies history of congestive heart failure or coronary artery disease.  Symptoms have been gradual onset are slowly progressive are now moderate severity worse throughout the day and improved when lying flat.    Past Medical History:  Diagnosis Date  . Diabetes mellitus without complication (HCC)   . Hyperlipidemia   . Hypertension     Patient Active Problem List   Diagnosis Date Noted  . Sepsis (HCC) 03/01/2016    Past Surgical History:  Procedure Laterality Date  . ABDOMINAL HYSTERECTOMY    . Cataract surgery    . HEMORRHOID SURGERY      Prior to Admission medications   Medication Sig Start Date End Date Taking? Authorizing Provider  amLODipine (NORVASC) 10 MG tablet Take 10 mg by mouth daily.    [provider]  apixaban (ELIQUIS) 5 MG TABS tablet Take 1 tablet (5 mg total) by mouth 2 (two) times daily. Take 2 tablets twice a day for 6 days then take 1 tablet twice a day there after. 03/11/16   Gracelyn NurseJohnston, John D, MD  aspirin EC 81 MG tablet Take 81 mg by mouth daily.    [provider]  ciprofloxacin (CIPRO) 500 MG tablet Take 1 tablet (500 mg total) by mouth 2 (two) times daily. 03/05/16   Gracelyn NurseJohnston, John D, MD  furosemide (LASIX) 20 MG tablet Take 1 tablet (20 mg total) by mouth 2 (two) times daily. 08/24/18   Merrily Brittleifenbark, Arryana Tolleson, MD  glipiZIDE (GLUCOTROL XL) 10 MG 24 hr tablet Take 10 mg by mouth 2 (two) times daily.    [provider]  insulin aspart protamine - aspart (NOVOLOG MIX 70/30 FLEXPEN) (70-30) 100 UNIT/ML FlexPen Inject 20 Units into the skin 2 (two) times daily.    [provider]  magnesium gluconate (MAGONATE) 30 MG tablet Take 1 tablet (30 mg total) by mouth 2 (two) times daily. 08/24/18   Merrily Brittleifenbark, Hezekiah Veltre, MD  potassium chloride (K-DUR) 10 MEQ tablet Take 2 tablets (20 mEq total) by mouth daily. 08/24/18   Merrily Brittleifenbark, Detron Carras, MD  quinapril (ACCUPRIL) 40 MG tablet Take 40 mg by mouth daily.    [provider]  simvastatin (ZOCOR) 20 MG tablet Take 20 mg by mouth daily.    [provider]    Allergies Lipitor [atorvastatin calcium]  Family History  Problem Relation Age of Onset  . Leukemia Mother   . CAD Father     Social History Social History   Tobacco Use  . Smoking status: Never Smoker  . Smokeless tobacco: Never Used  Substance Use Topics  .  Alcohol use: No  . Drug use: No    Review of Systems Constitutional: No fever/chills Eyes: No visual changes. ENT: No sore throat. Cardiovascular: Denies chest pain. Respiratory: Denies shortness of breath. Gastrointestinal: No abdominal pain.  No nausea, no vomiting.  No diarrhea.  No constipation. Genitourinary: Negative for dysuria. Musculoskeletal: Positive for peripheral edema Skin: Negative for rash. Neurological: Negative for headaches, focal weakness or numbness.   ____________________________________________   PHYSICAL EXAM:  VITAL SIGNS: ED Triage Vitals  Enc Vitals Group     BP 08/24/18 1804 (!) 166/57     Pulse Rate 08/24/18 1804 97       Resp 08/24/18 1804 18     Temp 08/24/18 1804 98.2 F (36.8 C)     Temp Source 08/24/18 1804 Oral     SpO2 08/24/18 1804 99 %     Weight 08/24/18 1805 190 lb (86.2 kg)     Height 08/24/18 1805 5\' 8"  (1.727 m)     Head Circumference --      Peak Flow --      Pain Score 08/24/18 1805 8     Pain Loc --      Pain Edu? --      Excl. in GC? --     Constitutional: Alert and oriented x4 pleasant cooperative speaks in full clear sentences no diaphoresis Eyes: PERRL EOMI. Head: Atraumatic. Nose: No congestion/rhinnorhea. Mouth/Throat: No trismus Neck: No stridor.  Able to lie completely flat although with moderate JVD Cardiovascular: Normal rate, regular rhythm. Grossly normal heart sounds.  Good peripheral circulation. Respiratory: Normal respiratory effort.  No retractions. Lungs CTAB and moving good air Gastrointestinal: Obese soft nontender Musculoskeletal: 2+ pitting edema to knees bilaterally with legs equal in size Neurologic:  Normal speech and language. No gross focal neurologic deficits are appreciated. Skin:  Skin is warm, dry and intact. No rash noted. Psychiatric: Mood and affect are normal. Speech and behavior are normal.    ____________________________________________   DIFFERENTIAL includes but not limited to  Congestive heart failure, nephrotic syndrome, hypoalbuminemia, myxedema, amlodipine reaction ____________________________________________   LABS (all labs ordered are listed, but only abnormal results are displayed)  Labs Reviewed  BASIC METABOLIC PANEL - Abnormal; Notable for the following components:      Result Value   Potassium 3.0 (*)    Glucose, Bld 113 (*)    Creatinine, Ser 1.62 (*)    GFR calc non Af Amer 30 (*)    GFR calc Af Amer 35 (*)    All other components within normal limits  CBC - Abnormal; Notable for the following components:   WBC 10.9 (*)    Hemoglobin 11.2 (*)    HCT 35.5 (*)    All other components within normal limits   BRAIN NATRIURETIC PEPTIDE - Abnormal; Notable for the following components:   B Natriuretic Peptide 106.0 (*)    All other components within normal limits  HEPATIC FUNCTION PANEL - Abnormal; Notable for the following components:   Albumin 3.4 (*)    All other components within normal limits  TROPONIN I    Lab work reviewed by me with creatinine of 1.62 although last in her system is 1.4 from [place] in 2017.  BNP essentially normal.  Albumin slightly low.  Light hypokalemia __________________________________________  EKG  ED ECG REPORT I, Merrily Brittle, the attending physician, personally viewed and interpreted this ECG.  Date: 08/24/2018 EKG Time:  Rate: 94 Rhythm: normal sinus rhythm QRS Axis: normal Intervals: First-degree  AV block ST/T Wave abnormalities: normal Narrative Interpretation: no evidence of acute ischemia.  Poor R wave progression  ____________________________________________  RADIOLOGY  Chest x-ray reviewed by me with no acute disease ____________________________________________   PROCEDURES  Procedure(s) performed: no  Procedures  Critical Care performed: no  ____________________________________________   INITIAL IMPRESSION / ASSESSMENT AND PLAN / ED COURSE  Pertinent labs & imaging results that were available during my care of the patient were reviewed by me and considered in my medical decision making (see chart for details).   As part of my medical decision making, I reviewed the following data within the electronic MEDICAL RECORD NUMBER History obtained from family if available, nursing notes, old chart and ekg, as well as notes from prior ED visits.  The patient comes to the emergency department with bilateral lower extremity pitting edema.  She is on amlodipine however she clearly has some diffuse fluid overload given her JVD.  Unclear why exactly she is taking Lasix however I will give her 40 mg as an acute dose right now and double her home dose  from 20mg  to 20 mg twice daily and have her follow-up in heart failure clinic in 2 days.  I appreciate her slightly low potassium so I will replete 80 mEq now along with magnesium and prescribed potassium and magnesium for home.  Strict return precautions have been given.      ____________________________________________   FINAL CLINICAL IMPRESSION(S) / ED DIAGNOSES  Final diagnoses:  Peripheral edema  Hypokalemia      NEW MEDICATIONS STARTED DURING THIS VISIT:  Discharge Medication List as of 08/24/2018 11:10 PM    START taking these medications   Details  magnesium gluconate (MAGONATE) 30 MG tablet Take 1 tablet (30 mg total) by mouth 2 (two) times daily., Starting Sun 08/24/2018, Print    potassium chloride (K-DUR) 10 MEQ tablet Take 2 tablets (20 mEq total) by mouth daily., Starting Sun 08/24/2018, Print         Note:  This document was prepared using Dragon voice recognition software and may include unintentional dictation errors.     Merrily Brittle, MD 08/25/18 [phone]

## 2018-08-24 NOTE — ED Notes (Signed)
Spoke with pt and daughter; daughter says she needs to leave to take son home and put him to bed for school in the am; discussed a plan of placing pt in recliner in sub wait for comfort until treatment room available; pt will call daughter when she knows if she is getting discharged or admitted; daughter can return for her mother if discharged; pt and daughter understand some blood work has been added for further evaluation; pt taken via wheelchair to toilet and then will be taken to sub wait

## 2018-08-24 NOTE — ED Triage Notes (Signed)
Pt comes into the ED via EMS from home with c/o BL LE edema for the past 2-3 days, pt states it is very painful, denies any redness. Denies having a hx of swelling in the lower extremities in the past. Denies SOB or other sx..Marland Kitchen

## 2018-08-24 NOTE — Discharge Instructions (Signed)
Today it looks like you have too much fluid in your lungs as well as your legs so I am recommending that you double the amount of Lasix that you are taking (your water pill) and I like you to follow-up in the heart failure clinic in 2 days for recheck.  Lasix can make your potassium go down so I am also prescribing you both potassium and magnesium to help prevent this.  Please return to the emergency department for any concerns.  It was a pleasure to take care of you today, and thank you for coming to our emergency department.  If you have any questions or concerns before leaving please ask the nurse to grab me and I'm more than happy to go through your aftercare instructions again.  If you were prescribed any opioid pain medication today such as Norco, Vicodin, Percocet, morphine, hydrocodone, or oxycodone please make sure you do not drive when you are taking this medication as it can alter your ability to drive safely.  If you have any concerns once you are home that you are not improving or are in fact getting worse before you can make it to your follow-up appointment, please do not hesitate to call [phone] and come back for further evaluation.  Merrily BrittleNeil Sharrell Krawiec, MD  Results for orders placed or performed during the hospital encounter of 08/24/18  Basic metabolic panel  Result Value Ref Range   Sodium 142 135 - 145 mmol/L   Potassium 3.0 (L) 3.5 - 5.1 mmol/L   Chloride 103 98 - 111 mmol/L   CO2 31 22 - 32 mmol/L   Glucose, Bld 113 (H) 70 - 99 mg/dL   BUN 16 8 - 23 mg/dL   Creatinine, Ser 4.091.62 (H) 0.44 - 1.00 mg/dL   Calcium 9.0 8.9 - 81.110.3 mg/dL   GFR calc non Af Amer 30 (L) >60 mL/min   GFR calc Af Amer 35 (L) >60 mL/min   Anion gap 8 5 - 15  CBC  Result Value Ref Range   WBC 10.9 (H) 4.0 - 10.5 K/uL   RBC 4.06 3.87 - 5.11 MIL/uL   Hemoglobin 11.2 (L) 12.0 - 15.0 g/dL   HCT 91.435.5 (L) 78.236.0 - 95.646.0 %   MCV 87.4 80.0 - 100.0 fL   MCH 27.6 26.0 - 34.0 pg   MCHC 31.5 30.0 - 36.0 g/dL   RDW  21.313.9 08.611.5 - 57.815.5 %   Platelets 328 150 - 400 K/uL   nRBC 0.0 0.0 - 0.2 %  Brain natriuretic peptide  Result Value Ref Range   B Natriuretic Peptide 106.0 (H) 0.0 - 100.0 pg/mL  Hepatic function panel  Result Value Ref Range   Total Protein 7.9 6.5 - 8.1 g/dL   Albumin 3.4 (L) 3.5 - 5.0 g/dL   AST 23 15 - 41 U/L   ALT 15 0 - 44 U/L   Alkaline Phosphatase 112 38 - 126 U/L   Total Bilirubin 0.6 0.3 - 1.2 mg/dL   Bilirubin, Direct 0.1 0.0 - 0.2 mg/dL   Indirect Bilirubin 0.5 0.3 - 0.9 mg/dL   Dg Chest 2 View  Result Date: 08/24/2018 CLINICAL DATA:  Bilateral lower extremity edema. EXAM: CHEST - 2 VIEW COMPARISON:  03/01/2016 FINDINGS: Heart is normal size. Linear scarring at the left base. No confluent opacities or effusions. No acute bony abnormality. IMPRESSION: No active cardiopulmonary disease. Electronically Signed   By: Charlett NoseKevin  Dover M.D.   On: 08/24/2018 19:11

## 2018-08-25 LAB — TROPONIN I

## 2018-08-25 NOTE — ED Notes (Signed)
Pt given walker and instructed in use. Pt verbalizes understanding.

## 2018-09-01 NOTE — Progress Notes (Signed)
Patient ID: Anette Guarneri, female    DOB: 04-12-1941, 77 y.o.   MRN: [id number]  HPI  Ms Giacobbe is a 77 y/o female with a history of DM, hyperlipidemia, HTN, DM and questionable heart failure.   No EF to report.  Was in the ED 08/24/18 due to peripheral edema where she was treated and released.   She presents today for her initial visit with a chief complaint of minimal shortness of breath upon moderate exertion. She describes this as having been present for several weeks now. She has associated fatigue, head congestion, pedal edema, light-headedness and tremors in left hand. She denies any cough, chest pain, palpitations, abdominal distention or difficulty sleeping. Currently doesn't have any scales. She does feel like the swelling in her legs has improved some since her ED visit.   Past Medical History:  Diagnosis Date  . CHF (congestive heart failure) (HCC)   . Diabetes mellitus without complication (HCC)   . Hyperlipidemia   . Hypertension    Past Surgical History:  Procedure Laterality Date  . ABDOMINAL HYSTERECTOMY    . Cataract surgery    . HEMORRHOID SURGERY     Family History  Problem Relation Age of Onset  . Leukemia Mother   . CAD Father    Social History   Tobacco Use  . Smoking status: Never Smoker  . Smokeless tobacco: Never Used  Substance Use Topics  . Alcohol use: No   Allergies  Allergen Reactions  . Lipitor [Atorvastatin Calcium] Other (See Comments)    Muscle cramping/pain   Prior to Admission medications   Medication Sig Start Date End Date Taking? Authorizing Provider  amLODipine (NORVASC) 10 MG tablet Take 10 mg by mouth daily.   Yes [provider]  aspirin EC 81 MG tablet Take 81 mg by mouth daily.   Yes [provider]  furosemide (LASIX) 20 MG tablet Take 1 tablet (20 mg total) by mouth 2 (two) times daily. 08/24/18  Yes Merrily Brittle, MD  insulin aspart protamine - aspart (NOVOLOG MIX 70/30 FLEXPEN) (70-30) 100 UNIT/ML  FlexPen Inject 20 Units into the skin 2 (two) times daily.   Yes [provider]  magnesium gluconate (MAGONATE) 30 MG tablet Take 1 tablet (30 mg total) by mouth 2 (two) times daily. 08/24/18  Yes Merrily Brittle, MD  potassium chloride (K-DUR) 10 MEQ tablet Take 2 tablets (20 mEq total) by mouth daily. 08/24/18  Yes Merrily Brittle, MD  quinapril (ACCUPRIL) 40 MG tablet Take 40 mg by mouth daily.   Yes [provider]  simvastatin (ZOCOR) 20 MG tablet Take 20 mg by mouth daily.   Yes [provider]    Review of Systems  Constitutional: Positive for fatigue. Negative for appetite change.  HENT: Positive for congestion. Negative for postnasal drip and sore throat.   Eyes: Negative.   Respiratory: Positive for shortness of breath. Negative for cough and chest tightness.   Cardiovascular: Positive for leg swelling. Negative for chest pain and palpitations.  Gastrointestinal: Negative for abdominal distention and abdominal pain.  Endocrine: Negative.   Genitourinary: Negative.   Musculoskeletal: Positive for arthralgias (left arm). Negative for back pain.  Skin: Negative.   Allergic/Immunologic: Negative.   Neurological: Positive for tremors (left hand at times) and light-headedness. Negative for dizziness.  Hematological: Negative for adenopathy.  Psychiatric/Behavioral: Negative for dysphoric mood and sleep disturbance (sleeping on 1 pillow). The patient is not nervous/anxious.    Vitals:   09/02/18 0941  BP: Marland Kitchen)  141/58  Pulse: 98  Resp: 18  SpO2: 100%  Weight: 205 lb (93 kg)  Height: 5\' 8"  (1.727 m)   Wt Readings from Last 3 Encounters:  09/02/18 205 lb (93 kg)  08/24/18 190 lb (86.2 kg)  03/01/16 208 lb 1.6 oz (94.4 kg)   Lab Results  Component Value Date   CREATININE 1.38 (H) 09/02/2018   CREATININE 1.62 (H) 08/24/2018   CREATININE 1.05 (H) 03/05/2016   Physical Exam  Constitutional: She is oriented to person, place, and time. She appears  well-developed and well-nourished.  HENT:  Head: Normocephalic and atraumatic.  Neck: Normal range of motion. Neck supple.  Cardiovascular: Normal rate and regular rhythm.  Pulmonary/Chest: Effort normal. No respiratory distress. She has no wheezes. She has no rales.  Abdominal: Soft. She exhibits no distension.  Musculoskeletal:       Right lower leg: She exhibits edema (2+ pitting). She exhibits no tenderness.       Left lower leg: She exhibits edema (2+ pitting). She exhibits no tenderness.  Neurological: She is alert and oriented to person, place, and time.  Skin: Skin is warm and dry.  Psychiatric: She has a normal mood and affect. Her behavior is normal.  Nursing note and vitals reviewed.   Assessment & Plan:  1: Heart Failure with unknown EF- - NYHA class II - minimally fluid overloaded today - scales given and she was instructed to begin weighing daily and call for an overnight weight gain of >2 pounds or a weekly weight gain of >5 pounds - echo ordered 09/10/18 - has stopped adding salt to her food. Reviewed the importance of closely following a 2000mg  sodium diet and reviewed how to read food labels. Written dietary information was given to her about this - BMP 08/24/18 was 106.0 - patient says that she's received her flu vaccine for this season  2: HTN- - BP looks good today - [facility] - BMP from 08/24/18 reviewed and showed sodium 142, potassium 3.0, creatinine 1.62 and GFR 35  3: DM- - doesn't check her glucose consistently due to the expense of the test strips - will be getting glucose checked with BMP today  4: Hypokalemia- - will get BMP today  5: Lymphedema- - stage 2 - has TED hose but hasn't been wearing them consistently. Encouraged her to put them on each morning with removal at bedtime - currently limited in her ability to exercise due to edema - does elevate her legs some during the day and she was  encouraged to elevate them when she's sitting for long periods of time - could consider lymphapress compression boots if edema persists  Medication bottles were reviewed.  Return in 1 month or sooner for any questions/problems before then.

## 2018-09-02 ENCOUNTER — Ambulatory Visit: Payer: Medicare Other | Attending: Family | Admitting: Family

## 2018-09-02 ENCOUNTER — Encounter: Payer: Self-pay | Admitting: Family

## 2018-09-02 VITALS — BP 141/58 | HR 98 | Resp 18 | Ht 68.0 in | Wt 205.0 lb

## 2018-09-02 DIAGNOSIS — E109 Type 1 diabetes mellitus without complications: Secondary | ICD-10-CM

## 2018-09-02 DIAGNOSIS — Z7982 Long term (current) use of aspirin: Secondary | ICD-10-CM | POA: Insufficient documentation

## 2018-09-02 DIAGNOSIS — I10 Essential (primary) hypertension: Secondary | ICD-10-CM

## 2018-09-02 DIAGNOSIS — I110 Hypertensive heart disease with heart failure: Secondary | ICD-10-CM | POA: Insufficient documentation

## 2018-09-02 DIAGNOSIS — E785 Hyperlipidemia, unspecified: Secondary | ICD-10-CM | POA: Diagnosis not present

## 2018-09-02 DIAGNOSIS — Z8249 Family history of ischemic heart disease and other diseases of the circulatory system: Secondary | ICD-10-CM | POA: Insufficient documentation

## 2018-09-02 DIAGNOSIS — Z794 Long term (current) use of insulin: Secondary | ICD-10-CM | POA: Diagnosis not present

## 2018-09-02 DIAGNOSIS — E119 Type 2 diabetes mellitus without complications: Secondary | ICD-10-CM | POA: Insufficient documentation

## 2018-09-02 DIAGNOSIS — E876 Hypokalemia: Secondary | ICD-10-CM | POA: Diagnosis not present

## 2018-09-02 DIAGNOSIS — I509 Heart failure, unspecified: Secondary | ICD-10-CM

## 2018-09-02 DIAGNOSIS — I890 Lymphedema, not elsewhere classified: Secondary | ICD-10-CM | POA: Diagnosis not present

## 2018-09-02 DIAGNOSIS — Z79899 Other long term (current) drug therapy: Secondary | ICD-10-CM | POA: Insufficient documentation

## 2018-09-02 LAB — BASIC METABOLIC PANEL
Anion gap: 8 (ref 5–15)
BUN: 14 mg/dL (ref 8–23)
CHLORIDE: 103 mmol/L (ref 98–111)
CO2: 28 mmol/L (ref 22–32)
Calcium: 9.6 mg/dL (ref 8.9–10.3)
Creatinine, Ser: 1.38 mg/dL — ABNORMAL HIGH (ref 0.44–1.00)
GFR calc non Af Amer: 37 mL/min — ABNORMAL LOW (ref >60)
GFR, EST AFRICAN AMERICAN: 43 mL/min — AB (ref >60)
Glucose, Bld: 118 mg/dL — ABNORMAL HIGH (ref 70–99)
POTASSIUM: 4.4 mmol/L (ref 3.5–5.1)
SODIUM: 139 mmol/L (ref 135–145)

## 2018-09-02 NOTE — Patient Instructions (Addendum)
Begin weighing daily and call for an overnight weight gain of > 2 pounds or a weekly weight gain of >5 pounds. 

## 2018-09-10 ENCOUNTER — Ambulatory Visit
Admission: RE | Admit: 2018-09-10 | Discharge: 2018-09-10 | Disposition: A | Discharge status: Home / Self Care | Payer: Medicare Other | Source: Ambulatory Visit | Attending: Family | Admitting: Family

## 2018-09-10 DIAGNOSIS — I509 Heart failure, unspecified: Secondary | ICD-10-CM | POA: Diagnosis not present

## 2018-09-10 DIAGNOSIS — E119 Type 2 diabetes mellitus without complications: Secondary | ICD-10-CM | POA: Insufficient documentation

## 2018-09-10 DIAGNOSIS — E785 Hyperlipidemia, unspecified: Secondary | ICD-10-CM | POA: Insufficient documentation

## 2018-09-10 DIAGNOSIS — I110 Hypertensive heart disease with heart failure: Secondary | ICD-10-CM | POA: Diagnosis not present

## 2018-09-10 NOTE — Progress Notes (Signed)
*  PRELIMINARY RESULTS* Echocardiogram 2D Echocardiogram has been performed.  Cristela BlueHege, Cloy Cozzens 09/10/2018, 11:29 AM

## 2018-10-13 ENCOUNTER — Ambulatory Visit: Payer: Medicare Other | Admitting: Family

## 2018-10-31 ENCOUNTER — Ambulatory Visit: Payer: Medicare Other | Attending: Family | Admitting: Family

## 2018-10-31 ENCOUNTER — Encounter: Payer: Self-pay | Admitting: Family

## 2018-10-31 VITALS — BP 180/73 | HR 107 | Resp 18 | Ht 68.0 in | Wt 189.4 lb

## 2018-10-31 DIAGNOSIS — Z7901 Long term (current) use of anticoagulants: Secondary | ICD-10-CM | POA: Insufficient documentation

## 2018-10-31 DIAGNOSIS — I110 Hypertensive heart disease with heart failure: Secondary | ICD-10-CM | POA: Insufficient documentation

## 2018-10-31 DIAGNOSIS — I10 Essential (primary) hypertension: Secondary | ICD-10-CM

## 2018-10-31 DIAGNOSIS — M79602 Pain in left arm: Secondary | ICD-10-CM | POA: Diagnosis not present

## 2018-10-31 DIAGNOSIS — I890 Lymphedema, not elsewhere classified: Secondary | ICD-10-CM | POA: Diagnosis not present

## 2018-10-31 DIAGNOSIS — R5383 Other fatigue: Secondary | ICD-10-CM | POA: Insufficient documentation

## 2018-10-31 DIAGNOSIS — R0602 Shortness of breath: Secondary | ICD-10-CM | POA: Insufficient documentation

## 2018-10-31 DIAGNOSIS — Z79899 Other long term (current) drug therapy: Secondary | ICD-10-CM | POA: Insufficient documentation

## 2018-10-31 DIAGNOSIS — E785 Hyperlipidemia, unspecified: Secondary | ICD-10-CM | POA: Diagnosis not present

## 2018-10-31 DIAGNOSIS — Z8249 Family history of ischemic heart disease and other diseases of the circulatory system: Secondary | ICD-10-CM | POA: Insufficient documentation

## 2018-10-31 DIAGNOSIS — I5032 Chronic diastolic (congestive) heart failure: Secondary | ICD-10-CM | POA: Diagnosis not present

## 2018-10-31 DIAGNOSIS — E119 Type 2 diabetes mellitus without complications: Secondary | ICD-10-CM | POA: Insufficient documentation

## 2018-10-31 DIAGNOSIS — Z7982 Long term (current) use of aspirin: Secondary | ICD-10-CM | POA: Insufficient documentation

## 2018-10-31 MED ORDER — AMLODIPINE BESYLATE 10 MG PO TABS: 10.0000 mg | ORAL_TABLET | Freq: Every day | ORAL | 3 refills | Status: AC

## 2018-10-31 MED ORDER — FUROSEMIDE 20 MG PO TABS: 20.0000 mg | ORAL_TABLET | Freq: Every day | ORAL | 3 refills | Status: AC

## 2018-10-31 MED ORDER — SIMVASTATIN 20 MG PO TABS: 20.0000 mg | ORAL_TABLET | Freq: Every day | ORAL | 3 refills | Status: AC

## 2018-10-31 MED ORDER — QUINAPRIL HCL 40 MG PO TABS: 40.0000 mg | ORAL_TABLET | Freq: Every day | ORAL | 3 refills | Status: AC

## 2018-10-31 NOTE — Patient Instructions (Signed)
Continue weighing daily and call for an overnight weight gain of > 2 pounds or a weekly weight gain of >5 pounds. 

## 2018-10-31 NOTE — Progress Notes (Signed)
Patient ID: Becky GuarneriBarbara A Sutton, female    DOB: 12/07/1940, 78 y.o.   MRN: [id number]  HPI  Ms Becky Sutton is a 78 y/o female with a history of DM, hyperlipidemia, HTN, DM and questionable heart failure.   Echo report from 09/10/18 reviewed and showed an EF of 55-60%.   Was in the ED 08/24/18 due to peripheral edema where she was treated and released.   She presents today for a follow-up visit with a chief complaint of minimal shortness of breath upon moderate exertion. She describes this as chronic in nature having been present for several years. She has associated fatigue and left arm pain along with this. She denies any difficulty sleeping, dizziness, abdominal distention, palpitations, pedal edema, chest pain, cough or weight gain. Has been out of her amlodipine for the last 2 days. Overall feels much better than the last time she was here.   Past Medical History:  Diagnosis Date  . CHF (congestive heart failure) (HCC)   . Diabetes mellitus without complication (HCC)   . Hyperlipidemia   . Hypertension    Past Surgical History:  Procedure Laterality Date  . ABDOMINAL HYSTERECTOMY    . Cataract surgery    . HEMORRHOID SURGERY     Family History  Problem Relation Age of Onset  . Leukemia Mother   . CAD Father    Social History   Tobacco Use  . Smoking status: Never Smoker  . Smokeless tobacco: Never Used  Substance Use Topics  . Alcohol use: No   Allergies  Allergen Reactions  . Lipitor [Atorvastatin Calcium] Other (See Comments)    Muscle cramping/pain   Prior to Admission medications   Medication Sig Start Date End Date Taking? Authorizing Provider  furosemide (LASIX) 20 MG tablet Take 1 tablet (20 mg total) by mouth 2 (two) times daily. Patient taking differently: Take 20 mg by mouth daily.  08/24/18  Yes Merrily Brittleifenbark, Neil, MD  magnesium gluconate (MAGONATE) 30 MG tablet Take 1 tablet (30 mg total) by mouth 2 (two) times daily. 08/24/18  Yes Merrily Brittleifenbark, Neil, MD  quinapril  (ACCUPRIL) 40 MG tablet Take 40 mg by mouth daily.   Yes [provider]  simvastatin (ZOCOR) 20 MG tablet Take 20 mg by mouth daily.   Yes [provider]  amLODipine (NORVASC) 10 MG tablet Take 10 mg by mouth daily.    [provider]  aspirin EC 81 MG tablet Take 81 mg by mouth daily.    [provider]  insulin aspart protamine - aspart (NOVOLOG MIX 70/30 FLEXPEN) (70-30) 100 UNIT/ML FlexPen Inject 20 Units into the skin 2 (two) times daily.    [provider]  potassium chloride (K-DUR) 10 MEQ tablet Take 2 tablets (20 mEq total) by mouth daily. Patient not taking: Reported on 10/31/2018 08/24/18   Merrily Brittleifenbark, Neil, MD    Review of Systems  Constitutional: Positive for fatigue. Negative for appetite change.  HENT: Positive for congestion. Negative for postnasal drip and sore throat.   Eyes: Negative.   Respiratory: Positive for shortness of breath. Negative for cough and chest tightness.   Cardiovascular: Negative for chest pain, palpitations and leg swelling.  Gastrointestinal: Negative for abdominal distention and abdominal pain.  Endocrine: Negative.   Genitourinary: Negative.   Musculoskeletal: Positive for arthralgias (left arm). Negative for back pain.  Skin: Negative.   Allergic/Immunologic: Negative.   Neurological: Positive for tremors (left hand at times). Negative for dizziness and light-headedness.  Hematological: Negative for adenopathy.  Psychiatric/Behavioral: Negative for dysphoric mood and sleep disturbance (sleeping on 1 pillow). The patient is not nervous/anxious.    Vitals:   10/31/18 1221  BP: (!) 180/73  Pulse: (!) 107  Resp: 18  SpO2: 100%  Weight: 189 lb 6 oz (85.9 kg)  Height: 5\' 8"  (1.727 m)   Wt Readings from Last 3 Encounters:  10/31/18 189 lb 6 oz (85.9 kg)  09/02/18 205 lb (93 kg)  08/24/18 190 lb (86.2 kg)   Lab Results  Component Value Date   CREATININE 1.38 (H) 09/02/2018   CREATININE 1.62 (H)  08/24/2018   CREATININE 1.05 (H) 03/05/2016    Physical Exam Vitals signs and nursing note reviewed.  Constitutional:      Appearance: She is well-developed.  HENT:     Head: Normocephalic and atraumatic.  Neck:     Musculoskeletal: Normal range of motion and neck supple.  Cardiovascular:     Rate and Rhythm: Normal rate and regular rhythm.  Pulmonary:     Effort: Pulmonary effort is normal. No respiratory distress.     Breath sounds: No wheezing or rales.  Abdominal:     General: There is no distension.     Palpations: Abdomen is soft.  Musculoskeletal:     Right lower leg: She exhibits no tenderness. No edema.     Left lower leg: She exhibits no tenderness. No edema.  Skin:    General: Skin is warm and dry.  Neurological:     Mental Status: She is alert and oriented to person, place, and time.  Psychiatric:        Behavior: Behavior normal.     Assessment & Plan:  1: Chronic heart failure with preserved ejection fraction- - NYHA class II - euvolemic today - weighing daily and she was reminded to call for an overnight weight gain of >2 pounds or a weekly weight gain of >5 pounds - weight down 15 pounds since she was last here 2 months ago - not adding salt to her food. Reviewed the importance of closely following a 2000mg  sodium diet  - BMP 08/24/18 was 106.0 - patient says that she's received her flu vaccine for this season  2: HTN- - BP elevated but she's been out of her amlodipine for the last 2 days; new RX provided today and explained that she doesn't need to run out of her medications - [facility]; supposed to be having lab work done 11/14/2018 - BMP from 09/02/18 reviewed and showed sodium 139, potassium 4.4, creatinine 1.38 and GFR 43  3: Lymphedema- - resolved  Medication bottles were reviewed.  Return in 2 months or sooner for any questions/problems before then.

## 2018-12-30 ENCOUNTER — Ambulatory Visit: Payer: Medicare Other | Admitting: Family

## 2023-08-13 NOTE — Progress Notes (Signed)
 Referring Provider: Tobie Lauraine FORBES Vicci,*   PCP:  Sameul Debby Browns, MD  08/13/2023  Chief Complaint: follow-up for CKD  Background:  [name] is a 82 y.o. female with a h/o DM, HTN, and CKD who was referred by Lauraine FORBES Vicci Tobie, * for evaluation and management of CKD III. Per notes, pt interested in medical optimization but not dialysis.   With regards to her DM, reports that she has had this for over 20 years.  Current meds: levemir 20u nightly.  Has been referred to ophtho; she is not aware of having had retinopathy or diabetic changes (reports h/o cataract surgery perhaps 3-4 years ago).  States sometimes her BG drops low but the lowest it's gotten is 80 something.   With regards to her HTN, she says she has had this ever since she's been under her doctor's care (in the past 7 years).  Current meds: lisinopril  40mg  daily, amlodipine  10mg  daily, chlorthalidone 25mg  daily, propranolol 60mg  daily (for tremor), furosemide  20mg  daily.  Does not check home BPs (used to but no longer).   Seen by cardiology in 10/2018, they report this was after being hospitalized for swelling and almost passing out.  Per cardiology note: Echo report from 09/10/18 reviewed and showed an EF of 55-60%.  Was in the ED 08/24/18 due to peripheral edema where she was treated and released.  Looks like the furosemide  was started when in the ED and continued by cardiology with instructions for daily weights, though has not ever been seen by cardiology again. Sounds like she was told by PCP (Dr. Sameul) to stop the furosemide  at one point but then was started back on it (she's not sure why, just said it was at the pharmacy, denies having swelling). She reports when she takes it she pees all day and all night but not if she doesn't take it.   Per referral notes, had a fall earlier this year getting out of the shower - was feeling weak though not lightheaded.   No h/o CAD or CVA.   Uses NSAIDs (or  tylenol  but says she's used all of them) sometimes when she has HA, back pain, etc. Takes only 1 pill when she needs it and uses perhaps once or twice a week.   HPI: Becky Sutton returns today for follow-up. She is accompanied in clinic by her daughter.   Overall doing well, no complaints.   Not having any dizziness or lightheadedness anymore.   Had lost weight earlier this year with SGLT2i, but stabilized now since she stopped.  Wt Readings from Last 6 Encounters:  08/13/23 73 kg (161 lb)  05/13/23 73.8 kg (162 lb 12.8 oz)  02/12/23 82.1 kg (181 lb)     ROS:  11 systems reviewed and negative except those noted in the history of present illness   PAST MEDICAL HISTORY: No past medical history on file.  ALLERGIES Patient has no known allergies.  SOCIAL HISTORY      FAMILY HISTORY No family history on file.   MEDICATIONS: Current Outpatient Medications  Medication Sig Dispense Refill  . amlodipine  (NORVASC ) 5 MG tablet Take 1 tablet (5 mg total) by mouth daily. 90 tablet 3  . carbidopa-levodopa (SINEMET) 25-100 mg per tablet TAKE 1 TABLET BY MOUTH 4 (FOUR) TIMES DAILY TAKE 25/ 100 FOUR TIMES PER DAY    . chlorthalidone (HYGROTON) 25 MG tablet Take 0.5 tablets (12.5 mg total) by mouth every morning.    . insulin  detemir U-100 (  LEVEMIR FLEXPEN) 100 unit/mL (3 mL) injection pen Inject 0.1 mL (10 Units total) under the skin daily.    . lisinopril  (PRINIVIL ,ZESTRIL ) 40 MG tablet TAKE 1 TABLET BY MOUTH ONCE A DAY FOR HIGH BLOOD PRESSURE    . propranolol (INDERAL LA) 80 mg 24 hr capsule Take 1 capsule (80 mg total) by mouth daily.    . propranolol (INDERAL LA) 60 mg 24 hr capsule Take 1 capsule (60 mg total) by mouth daily.     No current facility-administered medications for this visit.    PHYSICAL EXAM:   Vitals:   08/13/23 1514  BP: 123/64  Pulse: 81  Temp: 36.2 C (97.1 F)   CONSTITUTIONAL: Alert, well appearing, no distress HEENT: Moist mucous membranes,  oropharynx clear without erythema or exudate. EOMI. Sclerae anicteric. CARDIOVASCULAR: RRR PULM: Clear to auscultation bilaterally EXTREMITIES: No lower extremity edema bilaterally.  NEUROLOGIC: No gross focal motor deficits PSYCH: alert and oriented x 3  MEDICAL DECISION MAKING  Results for orders placed or performed in visit on 08/06/23  Microalbumin / creatinine urine ratio  Result Value Ref Range   Creat U 103.2 Undefined mg/dL   Albumin Quantitative, Urine 0.8 Undefined mg/dL   Albumin/Creatinine Ratio 7.8 0.0 - 30.0 ug/mg  Magnesium  Level  Result Value Ref Range   Magnesium  1.7 1.6 - 2.6 mg/dL  Renal Function Panel  Result Value Ref Range   Sodium 140 135 - 145 mmol/L   Potassium 4.0 3.4 - 4.8 mmol/L   Chloride 105 98 - 107 mmol/L   CO2 30.0 20.0 - 31.0 mmol/L   Anion Gap 5 5 - 14 mmol/L   BUN 18 9 - 23 mg/dL   Creatinine 8.72 (H) 9.44 - 1.02 mg/dL   BUN/Creatinine Ratio 14    eGFR CKD-EPI (2021) Female 43 (L) >=60 mL/min/1.38m2   Glucose 128 70 - 179 mg/dL   Calcium 89.5 8.7 - 89.5 mg/dL   Phosphorus 3.1 2.4 - 5.1 mg/dL   Albumin 3.7 3.4 - 5.0 g/dL     Creatinine  Date Value Ref Range Status  08/06/2023 1.27 (H) 0.55 - 1.02 mg/dL Final  92/70/7975 8.70 (H) 0.55 - 1.02 mg/dL Final  94/92/7975 8.69 (H) 0.55 - 1.02 mg/dL Final     No results for input(s): NA, K, CL, BUN, CREATININE, GFR, GLU in the last 24 hours.  Invalid input(s): C02  11/30/22 -  Cr 1.8, K 5.3, bicarb 25, Ca 10.2, A1C 7.9  IMAGING STUDIES:   Renal US  04/10/23-  FINDINGS:    KIDNEYS: Mildly atrophic, echogenic kidneys, compatible with medical renal disease. Left kidney is mildly lobulated in contour. No solid masses or calculi. No hydronephrosis. Echogenic foci within the renal hilum likely correspond with vascular calcification.       Right kidney: 8.8 cm       Left kidney: 8.6 cm    BLADDER: Decompressed.       Bladder volume prevoid: 35.6 mL    Incidental note is made  of cholelithiasis.     ASSESSMENT/PLAN:  Becky Sutton is a 82 y.o. patient with a past medical history significant for DM, HTN, and CKD who is being seen in clinic.   CKD III: suspect this is 2/2 DM and/or age, possible HTN contributing. No proteinuria. SPEP/IFE, SFLC without evidence of paraproteinemia. Renal US  unremarkable - e/o chronic renal disease.  Lab Results  Component Value Date   CREATININE 1.27 (H) 08/06/2023  - Cr stable - The patient has been counseled on avoiding  nephrotoxic agents including but not limited to NSAIDs.  Discussed alternatives including Tylenol , lidocaine patches, and Voltaren gel.  - On atorvastatin 40mg  daily - has a listed allergy to atorvastatin, though she thinks the allergy listed may be wrong/may be to a different medication. LDL goal - TBD. No results found for: LDL Check LDL with next labs.  - UACR 7.40mcg/mg (wnl). ACEi/ARB- on quinapril  40mg  daily. SGLT2i - Jardiance 10mg  daily.  - KFRE - 0.2% at 2 years, 0.6% at 5 years - reassuring, discussed previously.  KFRE 2-Year: 0.2% at 08/06/2023  8:21 AM Calculated from: Serum Creatinine: 1.27 mg/dL at 89/70/7975  1:78 AM Urine Albumin Creatinine Ratio: 7.8 ug/mg at 08/06/2023  8:21 AM Age: 33 years Sex: Female at 08/06/2023  8:21 AM Has CKD-3 to CKD-5: No KFRE 5-Year: 0.6% at 08/06/2023  8:21 AM Calculated from: Serum Creatinine: 1.27 mg/dL at 89/70/7975  1:78 AM Urine Albumin Creatinine Ratio: 7.8 ug/mg at 08/06/2023  8:21 AM Age: 43 years Sex: Female at 08/06/2023  8:21 AM Has CKD-3 to CKD-5: No Lab Results  Component Value Date   HGB 12.5 02/12/2023  - No significant anemia  Bone Mineral Metabolism:  - Ca high-normal at 10.2 on most recent labs (do not have albumin to correct). Recheck today. She is on chlorthalidone which can cause hypercalcemia. She denies any calcium supplements. Had briefly started taking Vit D but then stopped at my instructions due to borderline high Ca.  Lab  Results  Component Value Date   PTH 78.3 02/12/2023   CALCIUM 10.4 08/06/2023  - Stopping thiazide and starting loop diuretic as below  H/o hyperkalemia: likely 2/2 CKD, ACEi, and diet. K 5.3 in 11/2022, subsequently improved and wnl.  Lab Results  Component Value Date   K 4.0 08/06/2023  - Have previously given information on low potassium diet and discussed - Do not need to stop ACEi unless K>5.5  HTN: Goal <130/80. At goal in clinic today. Decreased chlorthalidone at last visit and stopped Jardiance for hypotension.  BP Readings from Last 3 Encounters:  08/13/23 123/64  05/13/23 101/58  02/12/23 119/62  - Current meds are lisinopril  40mg  daily, amlodipine  5mg  daily, chlorthalidone 12.5mg  daily, propranolol 80mg  daily (for tremor).  - She is having some edema and calcium slightly elevated. Stop chlorthalidone, start Lasix  20mg  daily with instructions to increase to 40mg  daily if not noticing a difference in UOP or swelling.    Ms.Becky Sutton will follow up in 6 months The patient will need renal function panel, UACR, within 4 weeks prior to next visit.

## 2024-05-22 ENCOUNTER — Emergency Department: Admission: EM | Admit: 2024-05-22 | Discharge: 2024-05-22 | Disposition: A | Discharge status: Home / Self Care

## 2024-05-22 ENCOUNTER — Emergency Department

## 2024-05-22 ENCOUNTER — Other Ambulatory Visit: Payer: Self-pay

## 2024-05-22 DIAGNOSIS — D72829 Elevated white blood cell count, unspecified: Secondary | ICD-10-CM | POA: Diagnosis not present

## 2024-05-22 DIAGNOSIS — E1122 Type 2 diabetes mellitus with diabetic chronic kidney disease: Secondary | ICD-10-CM | POA: Insufficient documentation

## 2024-05-22 DIAGNOSIS — E876 Hypokalemia: Secondary | ICD-10-CM | POA: Insufficient documentation

## 2024-05-22 DIAGNOSIS — N189 Chronic kidney disease, unspecified: Secondary | ICD-10-CM | POA: Insufficient documentation

## 2024-05-22 DIAGNOSIS — I129 Hypertensive chronic kidney disease with stage 1 through stage 4 chronic kidney disease, or unspecified chronic kidney disease: Secondary | ICD-10-CM | POA: Insufficient documentation

## 2024-05-22 DIAGNOSIS — R55 Syncope and collapse: Secondary | ICD-10-CM | POA: Insufficient documentation

## 2024-05-22 LAB — COMPREHENSIVE METABOLIC PANEL WITH GFR
ALT: <5 U/L (ref 0–44)
AST: 19 U/L (ref 15–41)
Albumin: 2.5 g/dL — ABNORMAL LOW (ref 3.5–5.0)
Alkaline Phosphatase: 52 U/L (ref 38–126)
Anion gap: 7 (ref 5–15)
BUN: 18 mg/dL (ref 8–23)
CO2: 21 mmol/L — ABNORMAL LOW (ref 22–32)
Calcium: 7.4 mg/dL — ABNORMAL LOW (ref 8.9–10.3)
Chloride: 112 mmol/L — ABNORMAL HIGH (ref 98–111)
Creatinine, Ser: 0.99 mg/dL (ref 0.44–1.00)
GFR, Estimated: 57 mL/min — ABNORMAL LOW
Glucose, Bld: 253 mg/dL — ABNORMAL HIGH (ref 70–99)
Potassium: 3.4 mmol/L — ABNORMAL LOW (ref 3.5–5.1)
Sodium: 140 mmol/L (ref 135–145)
Total Bilirubin: 0.5 mg/dL (ref 0.0–1.2)
Total Protein: 5.3 g/dL — ABNORMAL LOW (ref 6.5–8.1)

## 2024-05-22 LAB — CBC
HCT: 36.9 % (ref 36.0–46.0)
Hemoglobin: 12 g/dL (ref 12.0–15.0)
MCH: 29.2 pg (ref 26.0–34.0)
MCHC: 32.5 g/dL (ref 30.0–36.0)
MCV: 89.8 fL (ref 80.0–100.0)
Platelets: 228 10*3/uL (ref 150–400)
RBC: 4.11 MIL/uL (ref 3.87–5.11)
RDW: 13 % (ref 11.5–15.5)
WBC: 11.8 10*3/uL — ABNORMAL HIGH (ref 4.0–10.5)
nRBC: 0 % (ref 0.0–0.2)

## 2024-05-22 MED ORDER — SODIUM CHLORIDE 0.9 % IV BOLUS
1000.0000 mL | Freq: Once | INTRAVENOUS | Status: AC
  Administered 2024-05-22: 1000 mL via INTRAVENOUS

## 2024-05-22 MED ORDER — ACETAMINOPHEN 325 MG PO TABS
650.0000 mg | ORAL_TABLET | Freq: Once | ORAL | Status: AC
  Administered 2024-05-22: 650 mg via ORAL
  Filled 2024-05-22: qty 2

## 2024-05-22 NOTE — Discharge Instructions (Addendum)
 You were seen in the emergency department after an episode of reported decreased responsiveness by your family however you stated that you just did not want to talk.  You did report a mild headache and feeling flushed.  Workup today was reassuring.  Please continue your regular medications.  Ensure adequate hydration.  I have placed an ambulatory referral to cardiology given your risk factors as you should be risk stratified.  You should receive a call within 3-5 business days.  Please call and make an appointment with your primary care physician and return if any acutely worsening symptoms or any other emergency. -- RETURN PRECAUTIONS & AFTERCARE: (ENGLISH) RETURN PRECAUTIONS: Return immediately to the emergency department or see/call your doctor if you feel worse, weak or have changes in speech or vision, are short of breath, have fever, vomiting, pain, bleeding or dark stool, trouble urinating or any new issues. Return here or see/call your doctor if not improving as expected for your suspected condition. FOLLOW-UP CARE: Call your doctor and/or any doctors we referred you to for more advice and to make an appointment. Do this today, tomorrow or after the weekend. Some doctors only take PPO insurance so if you have HMO insurance you may want to contact your HMO or your regular doctor for referral to a specialist within your plan. Either way tell the doctor's office that it was a referral from the emergency department so you get the soonest possible appointment.  YOUR TEST RESULTS: Take result reports of any blood or urine tests, imaging tests and EKG's to your doctor and any referral doctor. Have any abnormal tests repeated. Your doctor or a referral doctor can let you know when this should be done. Also make sure your doctor contacts this hospital to get any test results that are not currently available such as cultures or special tests for infection and final imaging reports, which are often not available  at the time you leave the ER but which may list additional important findings that are not documented on the preliminary report. BLOOD PRESSURE: If your blood pressure was greater than 120/80 have your blood pressure rechecked within 1 to 2 weeks. MEDICATION SIDE EFFECTS: Do not drive, walk, bike, take the bus, etc. if you have received or are being prescribed any sedating medications such as those for pain or anxiety or certain antihistamines like Benadryl. If you have been give one of these here get a taxi home or have a friend drive you home. Ask your pharmacist to counsel you on potential side effects of any new medication

## 2024-05-22 NOTE — ED Triage Notes (Signed)
 Pt comes via [facility] from [facility] with c/o near syncopal. Pt got really hot and sweaty while there. Pt just felt like she was going to pass out. Pt states pain in her neck that is sharp. Pt did not fall  93/49 and last BP 100/55 100% RA  CBG 155 98.4 temp HR-72 20 left AC Little fluid given

## 2024-05-22 NOTE — ED Notes (Addendum)
 Daughter to desk. To ask about update this RN noticed no vitals and no labs collected nor EKG obtained Will pull back ASAP and complete. Pt very clammy. Pt roomed.

## 2024-05-22 NOTE — ED Notes (Signed)
 Pt given DC instructions. Pt and daughter verbalized understanding of follow up care. Pt taken from ED in wheelchair. NAD noted at departure.

## 2024-05-22 NOTE — ED Notes (Signed)
Pt taken to Ct at this time

## 2024-05-22 NOTE — ED Provider Notes (Signed)
 [facility] Provider Note    Event Date/Time   First MD Initiated Contact with Patient 05/22/24 2105     (approximate)   History   Near Syncope   HPI  Becky Sutton is a 83 y.o. 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  Patient was dining with family at [employer] and reports that she developed a gradual onset headache and felt flushed.  She states that she laid her head and her hands.  Family attempted to talk to her but patient states that she did not want to converse, although she could hear everything that her family was saying.  She reports that her headache has largely resolved.  She denies any loss of consciousness she denies any aphasia.  She does have a long history of headaches and this was not the worst headache she ever experienced.  Daughter was at bedside and contributes to the history      Physical Exam   Triage Vital Signs: ED Triage Vitals  Encounter Vitals Group     BP 05/22/24 2057 (!) 95/56     Girls Systolic BP Percentile --      Girls Diastolic BP Percentile --      Boys Systolic BP Percentile --      Boys Diastolic BP Percentile --      Pulse --      Resp --      Temp 05/22/24 2057 98.5 F (36.9 C)     Temp Source 05/22/24 2057 Oral     SpO2 --      Weight --      Height --      Head Circumference --      Peak Flow --      Pain Score 05/22/24 1825 4     Pain Loc --      Pain Education --      Exclude from Growth Chart --     Most recent vital signs: Vitals:   05/22/24 2057 05/22/24 2200  BP: (!) 95/56 133/77  Pulse: 83 81  Resp: 17 12  Temp: 98.5 F (36.9 C) 98 F (36.7 C)  SpO2: 100% 100%    Nursing Triage Note reviewed. Vital signs reviewed and patients oxygen saturation is normoxic  General: Patient is well nourished, well developed, awake and alert, resting comfortably in no acute distress Head: Normocephalic and  atraumatic Eyes: Normal inspection, extraocular muscles intact, no conjunctival pallor Ear, nose, throat: Normal external exam Very dry mucous membranes Neck: Normal range of motion Respiratory: Patient is in no respiratory distress, lungs CTAB Cardiovascular: Patient is not tachycardic, RRR without murmur appreciated GI: Abd SNT with no guarding or rebound  Back: Normal inspection of the back with good strength and range of motion throughout all ext Extremities: pulses intact with good cap refills, no LE pitting edema or calf tenderness Neuro: The patient is alert and oriented to person, place, and time, appropriately conversive, with 5/5 bilat UE/LE strength, no gross motor or sensory defects noted. Coordination appears to be adequate.  Resting tremor which patient and daughter state is her baseline Skin: Warm, dry, and intact Psych: normal mood and affect, no SI or HI  ED Results / Procedures / Treatments   Labs (all labs ordered are listed, but only abnormal results are displayed) Labs Reviewed  COMPREHENSIVE METABOLIC PANEL WITH GFR - Abnormal; Notable for the following components:  Result Value   Potassium 3.4 (*)    Chloride 112 (*)    CO2 21 (*)    Glucose, Bld 253 (*)    Calcium 7.4 (*)    Total Protein 5.3 (*)    Albumin 2.5 (*)    GFR, Estimated 57 (*)    All other components within normal limits  CBC - Abnormal; Notable for the following components:   WBC 11.8 (*)    All other components within normal limits  CBG MONITORING, ED     EKG EKG and rhythm strip are interpreted by myself:   EKG: [Normal sinus rhythm] at heart rate of 70, normal QRS duration, QTc 364, nonspecific ST segments and T waves no ectopy EKG not consistent with Acute STEMI Rhythm strip: NSR in lead II Of note there was a poor baseline as patient's resting tremor gets in the way however QRS marches out to a rate of approximately 70   RADIOLOGY CT head: Demonstrates no intracranial  hemorrhage per my independent review interpretation and radiologist agrees CT C-spine: Demonstrates no cervical spine fracture    PROCEDURES:  Critical Care performed: No  Procedures   MEDICATIONS ORDERED IN ED: Medications  sodium chloride  0.9 % bolus 1,000 mL (0 mLs Intravenous Stopped 05/22/24 2246)  acetaminophen  (TYLENOL ) tablet 650 mg (650 mg Oral Given 05/22/24 2223)     IMPRESSION / MDM / ASSESSMENT AND PLAN / ED COURSE                                Differential diagnosis includes, but is not limited to: No syncopal event, arrhythmia, electrolyte derangement, anemia, intracranial hemorrhage, C-spine abnormality  ED course: Patient is adamant that she never lost consciousness and never had aphasia but just wanted to be left alone for moment.  EKG demonstrated no arrhythmia.  CT head demonstrated no intracranial hemorrhage.  She had no profound electrolyte derangements or anemia.  She felt much improved after a bolus of IV fluid and a dose of Tylenol .  Per the daughter at bedside, patient is at her baseline.  Although this was not a true syncopal event, patient has never been risk stratified and does have risk factors for CAD.  I will place an ambulatory referral for cardiology.  Patient and daughter requested discharge   Clinical Course as of 05/22/24 2313  Fri May 22, 2024  2220 Creatinine: 0.99 At patient's baseline [HD]  2220 Comprehensive metabolic panel(!) Mild elevated glucose but patient just ate [HD]    Clinical Course User Index [HD] Nicholaus Rolland BRAVO, MD   At time of discharge there is no evidence of acute life, limb, vision, or fertility threat. Patient has stable vital signs, pain is well controlled, patient is ambulatory and p.o. tolerant.  Discharge instructions were completed using the [employer] system. I would refer you to those at this time. All warnings prescriptions follow-up etc. were discussed in detail with the patient. Patient indicates understanding  and is agreeable with this plan. All questions answered.  Patient is made aware that they may return to the emergency department for any worsening or new condition or for any other emergency. --  Risk: 5 This patient has a high risk of morbidity due to further diagnostic testing or treatment. Rationale: This patient's evaluation and management involve a high risk of morbidity due to the potential severity of presenting symptoms, need for diagnostic testing, and/or initiation of treatment that may require  close monitoring. The differential includes conditions with potential for significant deterioration or requiring escalation of care. Treatment decisions in the ED, including medication administration, procedural interventions, or disposition planning, reflect this level of risk. Additional Support: -- Drug therapy requiring intensive monitoring for toxicity [ ]  -- Decision regarding elective major surgery with idenitified patient or procedure risk factors [ ]  -- Decision regarding hospitalization or escalation of hospital-level care [ ]  -- Decision not to resuscitate or to de-escalate care because of poor prognosis [ ]  -- Parental controlled substances [ ]   COPA: 5 The patient has a severe exacerbation, progression, or side effect of treatment of the following illness/illnesses: []  OR  The patient has the following acute or chronic illness/injury that poses a possible threat to life or bodily function: [X] : The patient has a potentially serious acute condition or an acute exacerbation of a chronic illness requiring urgent evaluation and management in the Emergency Department. The clinical presentation necessitates immediate consideration of life-threatening or function-threatening diagnoses, even if they are ultimately ruled out.  Data(2/3 categories following were performed): 5 I reviewed or ordered at least three unique tests, external notes, and/or the history required an independent  historian as one of the three requirements as following: cbc, cmp, daugther AND  I independently interpreted the following test: CT head OR  I discussed the management of the patient with the following external physician or qualified healthcare provider: []     Suggested E/M Coding Level: 5, 99285, This has been selected based on the 06/22/22 CPT guidelines for E/M codes in the Emergency Department based on 2/3 of the CoPA, Data, and Risk.   FINAL CLINICAL IMPRESSION(S) / ED DIAGNOSES   Final diagnoses:  Near syncope     Rx / DC Orders   ED Discharge Orders          Ordered    Ambulatory referral to Cardiology       Comments: If you have not heard from the Cardiology office within the next 72 hours please call [phone].   05/22/24 06/22/20             Note:  This document was prepared using Dragon voice recognition software and may include unintentional dictation errors.   Nicholaus Rolland BRAVO, MD 05/22/24 [phone]

## 2024-05-22 NOTE — ED Notes (Signed)
 Introduced self to pt and daughter. Call bell in reach. NAD noted at this time.
